# Patient Record
Sex: Female | Born: 1955
Health system: Southern US, Community
[De-identification: ages and names within clinical notes are randomized; demographics above are authoritative.]

## PROBLEM LIST (undated history)

## (undated) DIAGNOSIS — J40 Bronchitis, not specified as acute or chronic: Secondary | ICD-10-CM

## (undated) DIAGNOSIS — I10 Essential (primary) hypertension: Secondary | ICD-10-CM

## (undated) HISTORY — DX: Bronchitis, not specified as acute or chronic: J40

## (undated) HISTORY — DX: Essential (primary) hypertension: I10

## (undated) HISTORY — PX: TUBAL LIGATION: SHX77

---

## 2000-10-21 ENCOUNTER — Other Ambulatory Visit: Admission: RE | Admit: 2000-10-21 | Discharge: 2000-10-21 | Payer: Self-pay | Admitting: Obstetrics and Gynecology

## 2001-12-01 ENCOUNTER — Other Ambulatory Visit: Admission: RE | Admit: 2001-12-01 | Discharge: 2001-12-01 | Payer: Self-pay | Admitting: Obstetrics and Gynecology

## 2005-01-15 ENCOUNTER — Ambulatory Visit: Payer: Self-pay | Admitting: Internal Medicine

## 2005-01-16 ENCOUNTER — Ambulatory Visit: Payer: Self-pay | Admitting: Internal Medicine

## 2006-12-05 ENCOUNTER — Encounter: Admission: RE | Admit: 2006-12-05 | Discharge: 2006-12-05 | Payer: Self-pay | Admitting: Obstetrics & Gynecology

## 2008-04-07 HISTORY — PX: NASAL SINUS SURGERY: SHX719

## 2008-11-11 ENCOUNTER — Ambulatory Visit: Payer: Self-pay | Admitting: Internal Medicine

## 2008-11-11 DIAGNOSIS — J328 Other chronic sinusitis: Secondary | ICD-10-CM | POA: Insufficient documentation

## 2008-11-11 DIAGNOSIS — J441 Chronic obstructive pulmonary disease with (acute) exacerbation: Secondary | ICD-10-CM | POA: Insufficient documentation

## 2008-11-11 LAB — CONVERTED CEMR LAB
Basophils Absolute: 0 10*3/uL (ref 0.0–0.1)
Eosinophils Relative: 7.6 % — ABNORMAL HIGH (ref 0.0–5.0)
Hemoglobin: 12.7 g/dL (ref 12.0–15.0)
Lymphocytes Relative: 17.8 % (ref 12.0–46.0)
Monocytes Relative: 8.1 % (ref 3.0–12.0)
Neutro Abs: 3 10*3/uL (ref 1.4–7.7)
RBC: 4.64 M/uL (ref 3.87–5.11)
RDW: 15.1 % — ABNORMAL HIGH (ref 11.5–14.6)
WBC: 4.5 10*3/uL (ref 4.5–10.5)

## 2008-11-23 DIAGNOSIS — I10 Essential (primary) hypertension: Secondary | ICD-10-CM | POA: Insufficient documentation

## 2008-11-28 ENCOUNTER — Telehealth (INDEPENDENT_AMBULATORY_CARE_PROVIDER_SITE_OTHER): Payer: Self-pay | Admitting: *Deleted

## 2008-12-27 ENCOUNTER — Ambulatory Visit: Payer: Self-pay | Admitting: Internal Medicine

## 2008-12-28 ENCOUNTER — Telehealth: Payer: Self-pay | Admitting: Internal Medicine

## 2008-12-30 ENCOUNTER — Ambulatory Visit: Payer: Self-pay | Admitting: Internal Medicine

## 2009-01-16 ENCOUNTER — Encounter: Payer: Self-pay | Admitting: Internal Medicine

## 2009-02-02 ENCOUNTER — Ambulatory Visit: Payer: Self-pay | Admitting: Internal Medicine

## 2009-02-27 ENCOUNTER — Telehealth: Payer: Self-pay | Admitting: Internal Medicine

## 2009-03-03 ENCOUNTER — Telehealth: Payer: Self-pay | Admitting: Internal Medicine

## 2009-03-03 ENCOUNTER — Ambulatory Visit: Payer: Self-pay | Admitting: Internal Medicine

## 2009-03-07 ENCOUNTER — Ambulatory Visit: Payer: Self-pay | Admitting: Internal Medicine

## 2009-03-14 ENCOUNTER — Encounter: Payer: Self-pay | Admitting: Internal Medicine

## 2009-03-27 ENCOUNTER — Telehealth (INDEPENDENT_AMBULATORY_CARE_PROVIDER_SITE_OTHER): Payer: Self-pay | Admitting: *Deleted

## 2009-04-17 ENCOUNTER — Encounter: Payer: Self-pay | Admitting: Internal Medicine

## 2009-04-28 ENCOUNTER — Encounter: Payer: Self-pay | Admitting: Internal Medicine

## 2009-05-02 ENCOUNTER — Telehealth: Payer: Self-pay | Admitting: Internal Medicine

## 2009-05-19 ENCOUNTER — Encounter: Payer: Self-pay | Admitting: Internal Medicine

## 2009-06-21 ENCOUNTER — Encounter: Payer: Self-pay | Admitting: Internal Medicine

## 2009-06-28 ENCOUNTER — Encounter: Payer: Self-pay | Admitting: Internal Medicine

## 2009-08-02 ENCOUNTER — Ambulatory Visit: Payer: Self-pay | Admitting: Internal Medicine

## 2009-08-02 LAB — CONVERTED CEMR LAB
Basophils Relative: 0.9 % (ref 0.0–3.0)
Eosinophils Relative: 0.9 % (ref 0.0–5.0)
Hemoglobin: 13.4 g/dL (ref 12.0–15.0)
Lymphocytes Relative: 23.9 % (ref 12.0–46.0)
MCV: 90.2 fL (ref 78.0–100.0)
Monocytes Absolute: 0.8 10*3/uL (ref 0.1–1.0)
Neutrophils Relative %: 66.8 % (ref 43.0–77.0)
RBC: 4.62 M/uL (ref 3.87–5.11)
WBC: 10 10*3/uL (ref 4.5–10.5)

## 2009-08-09 ENCOUNTER — Encounter: Payer: Self-pay | Admitting: Internal Medicine

## 2009-08-09 ENCOUNTER — Telehealth: Payer: Self-pay | Admitting: Internal Medicine

## 2009-08-09 LAB — CONVERTED CEMR LAB: IgE (Immunoglobulin E), Serum: 142.5 intl units/mL (ref 0.0–180.0)

## 2009-08-18 ENCOUNTER — Ambulatory Visit: Payer: Self-pay | Admitting: Internal Medicine

## 2009-08-21 ENCOUNTER — Encounter: Payer: Self-pay | Admitting: Internal Medicine

## 2009-08-28 ENCOUNTER — Encounter: Payer: Self-pay | Admitting: Internal Medicine

## 2009-08-28 ENCOUNTER — Telehealth: Payer: Self-pay | Admitting: Internal Medicine

## 2009-08-28 DIAGNOSIS — J45909 Unspecified asthma, uncomplicated: Secondary | ICD-10-CM | POA: Insufficient documentation

## 2009-08-29 ENCOUNTER — Telehealth: Payer: Self-pay | Admitting: Internal Medicine

## 2009-08-29 ENCOUNTER — Encounter: Payer: Self-pay | Admitting: Internal Medicine

## 2009-09-04 ENCOUNTER — Encounter: Payer: Self-pay | Admitting: Internal Medicine

## 2009-09-12 ENCOUNTER — Telehealth (INDEPENDENT_AMBULATORY_CARE_PROVIDER_SITE_OTHER): Payer: Self-pay | Admitting: *Deleted

## 2009-10-02 ENCOUNTER — Telehealth: Payer: Self-pay | Admitting: Internal Medicine

## 2009-10-09 ENCOUNTER — Encounter: Payer: Self-pay | Admitting: Internal Medicine

## 2009-11-28 ENCOUNTER — Ambulatory Visit: Payer: Self-pay | Admitting: Internal Medicine

## 2009-11-28 ENCOUNTER — Telehealth: Payer: Self-pay | Admitting: Internal Medicine

## 2009-12-05 ENCOUNTER — Encounter: Payer: Self-pay | Admitting: Internal Medicine

## 2009-12-06 ENCOUNTER — Ambulatory Visit: Payer: Self-pay | Admitting: Internal Medicine

## 2009-12-06 DIAGNOSIS — J45909 Unspecified asthma, uncomplicated: Secondary | ICD-10-CM | POA: Insufficient documentation

## 2009-12-21 ENCOUNTER — Ambulatory Visit: Payer: Self-pay | Admitting: Internal Medicine

## 2009-12-22 ENCOUNTER — Ambulatory Visit: Payer: Self-pay | Admitting: Internal Medicine

## 2010-01-09 ENCOUNTER — Ambulatory Visit: Payer: Self-pay | Admitting: Internal Medicine

## 2010-01-22 ENCOUNTER — Ambulatory Visit: Payer: Self-pay | Admitting: Internal Medicine

## 2010-02-05 ENCOUNTER — Ambulatory Visit: Payer: Self-pay | Admitting: Internal Medicine

## 2010-02-19 ENCOUNTER — Ambulatory Visit: Payer: Self-pay | Admitting: Internal Medicine

## 2010-03-05 ENCOUNTER — Ambulatory Visit: Payer: Self-pay | Admitting: Internal Medicine

## 2010-03-19 ENCOUNTER — Telehealth (INDEPENDENT_AMBULATORY_CARE_PROVIDER_SITE_OTHER): Payer: Self-pay | Admitting: *Deleted

## 2010-03-19 ENCOUNTER — Ambulatory Visit: Payer: Self-pay | Admitting: Internal Medicine

## 2010-03-19 DIAGNOSIS — J0411 Acute tracheitis with obstruction: Secondary | ICD-10-CM | POA: Insufficient documentation

## 2010-03-23 ENCOUNTER — Ambulatory Visit: Payer: Self-pay | Admitting: Cardiology

## 2010-04-12 ENCOUNTER — Encounter: Payer: Self-pay | Admitting: Internal Medicine

## 2010-04-13 ENCOUNTER — Ambulatory Visit: Payer: Self-pay | Admitting: Internal Medicine

## 2010-04-18 LAB — CONVERTED CEMR LAB: Toxoplasma IgG Ratio: <0.5

## 2010-05-03 ENCOUNTER — Ambulatory Visit: Payer: Self-pay | Admitting: Internal Medicine

## 2010-05-04 ENCOUNTER — Ambulatory Visit: Payer: Self-pay | Admitting: Infectious Diseases

## 2010-05-04 LAB — CONVERTED CEMR LAB
AST: 25 units/L (ref 0–37)
Alkaline Phosphatase: 70 units/L (ref 39–117)
BUN: 19 mg/dL (ref 6–23)
Basophils Absolute: 0 10*3/uL (ref 0.0–0.1)
Basophils Relative: 0 % (ref 0–1)
Creatinine, Ser: 0.83 mg/dL (ref 0.40–1.20)
Eosinophils Relative: 2 % (ref 0–5)
Glucose, Bld: 181 mg/dL — ABNORMAL HIGH (ref 70–99)
HCT: 42 % (ref 36.0–46.0)
Hemoglobin: 13.4 g/dL (ref 12.0–15.0)
Lymphocytes Relative: 12 % (ref 12–46)
MCHC: 31.9 g/dL (ref 30.0–36.0)
Monocytes Absolute: 0.4 10*3/uL (ref 0.1–1.0)
Monocytes Relative: 5 % (ref 3–12)
Neutro Abs: 5.5 10*3/uL (ref 1.7–7.7)
RBC: 4.64 M/uL (ref 3.87–5.11)
RDW: 14.3 % (ref 11.5–15.5)

## 2010-05-07 ENCOUNTER — Encounter: Payer: Self-pay | Admitting: Infectious Diseases

## 2010-05-09 ENCOUNTER — Encounter: Payer: Self-pay | Admitting: Internal Medicine

## 2010-05-23 ENCOUNTER — Encounter: Payer: Self-pay | Admitting: Internal Medicine

## 2010-05-24 ENCOUNTER — Telehealth (INDEPENDENT_AMBULATORY_CARE_PROVIDER_SITE_OTHER): Payer: Self-pay | Admitting: *Deleted

## 2010-06-06 ENCOUNTER — Encounter: Payer: Self-pay | Admitting: Internal Medicine

## 2010-07-03 ENCOUNTER — Encounter: Payer: Self-pay | Admitting: Internal Medicine

## 2010-07-18 ENCOUNTER — Ambulatory Visit
Admission: RE | Admit: 2010-07-18 | Discharge: 2010-07-18 | Payer: Self-pay | Source: Home / Self Care | Attending: Internal Medicine | Admitting: Internal Medicine

## 2010-07-30 ENCOUNTER — Encounter: Payer: Self-pay | Admitting: Internal Medicine

## 2010-08-07 ENCOUNTER — Encounter: Payer: Self-pay | Admitting: Internal Medicine

## 2010-08-07 NOTE — Miscellaneous (Signed)
Summary: HIPAA Resrrictions  HIPAA Resrrictions   Imported By: Florinda Marker 05/08/2010 10:11:12  _____________________________________________________________________  External Attachment:    Type:   Image     Comment:   External Document

## 2010-08-07 NOTE — Assessment & Plan Note (Signed)
Summary: xolair/apc  Nurse Visit   Allergies: No Known Drug Allergies  Medication Administration  Injection # 1:    Medication: Xolair (omalizumab) 150mg     Diagnosis: ASTHMA, EXTRINSIC (ICD-493.00)    Route: SQ    Site: R deltoid    Exp Date: 11/05/2012    Lot #: [id number]    Mfr: GENENTECH    Comments: 0.9 ML IN RIGHT AND LEFT ARM PT WAITED 30 MINS CHARGED [phone]    Patient tolerated injection without complications    Given by: TAMMY SCOTT IN [facility]ERGY LAB   Medication Administration  Injection # 1:    Medication: Xolair (omalizumab) 150mg     Diagnosis: ASTHMA, EXTRINSIC (ICD-493.00)    Route: SQ    Site: R deltoid    Exp Date: 11/05/2012    Lot #: [id number]    Mfr: GENENTECH    Comments: 0.9 ML IN RIGHT AND LEFT ARM PT WAITED 30 MINS CHARGED [phone]    Patient tolerated injection without complications    Given by: TAMMY SCOTT IN [facility]ERGY LAB

## 2010-08-07 NOTE — Letter (Signed)
Summary: [facility]  [facility]   Imported By: Sherian Rein 09/01/2009 09:35:04  _____________________________________________________________________  External Attachment:    Type:   Image     Comment:   External Document

## 2010-08-07 NOTE — Assessment & Plan Note (Signed)
Summary: ROV 3 WKS ///KP   Primary Provider/Referring Provider:  Dr. Geoffry Paradise  CC:  3 week follow up visit-sinus infection again.Marland Kitchen  History of Present Illness: December 22, 2009- Rhinosinusitis, bronchitis/ Xolair, cough.............................daughter here Cough and nasal discharge are now white, which is better. After 2 Xolair injections she notes cough is much better. Still some head congestion. Continues nasal vancomycin nebs. Going to beach for 2 weeks. Her office building is going to be checked for mold.  March 19, 2010- Rhinosinusitis, bronchitis/ Xolair/ steroids, cough Has been down to 2.5 mg prednisone for last 2 weeks. For last week  has been coughing more. ENT put her on omeprazole. Raspy wheeze seems to come from her throat esp while lying down. Harsh cough. Nonproductive and not itching. Not sure if Xolair was doing anything in retrospect, now that prednisone is lower. She feels "cellophane" sensation over windpipe at level of larynx. Feels sore at larynx when she swallows without hangup. She feels there is a mechanical event at the level of larynx and we discussed trying to better image this.  April 13, 2010- Rhinosinusitis, bronchitis/ Xolair/ steroids, cough...........daughter here cc: 3 week follow up visit-sinus infection again. Sinusitis and polyps are back. Saw Dr [name] yesterday. He recultured and will give antibioitic based on outcome. . 5 years ago got GI parasite in [place], treated by Dr [name]. All her respiratory problems began after she cleaned a dirty apartment with a lot of cats. She now asks if I can check for toxoplasmosis. We reviewed her CT sinus, which was broadly done and significant for chronic sinusitis. She is snoring. We reviewed her [facility] immunology studies.  Off prednisone now 2.5 weeks as isntructed. She stopped Xolair as ineffective.  I spoke with Dr [name] while she was here. He has considered retry of nasal aerosol antibiotics based  on pending culture. She reminds me that she was treated with prolonged nasal antibiotics from [facility] in the past.    Asthma History    Asthma Control Assessment:    Age range: 12+ years    Symptoms: throughout the day    Nighttime Awakenings: 0-2/month    Interferes w/ normal activity: some limitations    SABA use (not for EIB): >2 days/week    Asthma Control Assessment: Very Poorly Controlled   Preventive Screening-Counseling & Management  Alcohol-Tobacco     Smoking Status: never  Current Medications (verified): 1)  Furosemide 40 Mg Tabs (Furosemide) .... Take One Tablet Daily. 2)  Klor-Con 20 Meq Pack (Potassium Chloride) .... Take One Tablet Daily. 3)  Ferus 150 Mg Caps (Polysaccharide Iron Complex) .... Take One Tablet Daily. 4)  Dulera 200-5 Mcg/act Aero (Mometasone Furo-Formoterol Fum) .... 2 Puffs and Rinse Well, Twice Daily Instead of Advair--Sample 5)  Saline Nasal Rinse .... Use Two Times A Day 6)  Zoloft 100 Mg Tabs (Sertraline Hcl) .... Take 1 and 1/2 Tablets Daily. (150 Mg) 7)  Alprazolam 0.25 Mg Tabs (Alprazolam) .... Use As Needed 8)  Calcium 1200-1000 Mg-Unit Chew (Calcium Carbonate-Vit D-Min) .... Take 1 By Mouth Once Daily 9)  Multivitamins  Tabs (Multiple Vitamin) .... Take 1 By Mouth Once Daily 10)  Diovan 160 Mg Tabs (Valsartan) .Marland Kitchen.. 1 Once Daily 11)  Aldactone 25 Mg Tabs (Spironolactone) .Marland Kitchen.. 1 Once Daily 12)  Omeprazole 40 Mg Cpdr (Omeprazole) .... Take 1 By Mouth Once Daily 13)  Sinus Neb .... Vancomycin.saline.nasonex Two Times A Day 14)  Epipen 0.3 Mg/0.59ml Devi (Epinephrine) .... For Severe Allergic Reaction  Allergies (verified): No Known Drug Allergies  Past History:  Past Medical History: Last updated: 03/19/2010 Chronic sinusitis with polyps- ok ASA- Skin Test 02/02/09 Asthma Bronchitis Hypertension  Past Surgical History: Last updated: 08/02/2009 Endoscopic sinus surgery 10/09- Dr Pollyann Kennedy, Nov 2010- Dr [name] Tubal ligation  Family  History: Last updated: 03/07/2009 Heart Disease - father, brother Clotting Disorders - mother, sister Mother had DVT/PE after ankle fx Cancer - sister; melanoma Stroke - father  Social History: Last updated: 11/11/2008 Married children, grandchild 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  The patient denies shortness of breath at rest, productive cough, coughing up blood, chest pain, irregular heartbeats, acid heartburn, indigestion, loss of appetite, weight change, abdominal pain, difficulty swallowing, sore throat, tooth/dental problems, headaches, ear ache, hand/feet swelling, joint stiffness or pain, and rash.    Vital Signs:  Patient profile:   55 year old female Height:      72 inches Weight:      221.38 pounds BMI:     30.13 O2 Sat:      98 % on Room air Pulse rate:   93 / minute BP sitting:   140 / 92  (right arm) Cuff size:   regular  Vitals Entered By: [name] CMA (April 13, 2010 3:14 PM)  O2 Flow:  Room air CC: 3 week follow up visit-sinus infection again.   Physical Exam  Additional Exam:  General: A/Ox3; pleasant and cooperative, NAD, tall.  SKIN: no rash, lesions,  NODES: no lymphadenopathy HEENT: [place]/AT, EOM- WNL, Conjuctivae- clear, PERRLA, Periorbital edema-  TM-WNL, Nose- mucosa pale, unobstructed,  . Throat- clear and wnl, Mallampati  II, voice normal, no stridor NECK: Supple w/ fair ROM, JVD- none, normal carotid impulses w/o bruits Thyroid- ,  CHEST: clear to P&A- no wheeze, but loose cough with deep breath. HEART: RRR, no m/g/r heard ABDOMEN:soft, not distended UEA:VWUJ, nl pulses, no edema  NEURO: Grossly intact to observation      CT of Sinus  Procedure date:   03/23/2010  Findings:      CT NECK W/CM - [id number]   Clinical Data: 55 year old female with questionable laryngeal obstruction.  Cough and  change in vocal quality.  No relief with steroid therapy.   CT NECK WITH CONTRAST   Technique:  Multidetector CT imaging of the neck was performed with intravenous contrast.   Contrast: 75 ml Omnipaque-300.   Comparison: None.   Findings: No lymphadenopathy.  Pharyngeal mucosal spaces and the thyroid are within normal limits.  Parapharyngeal, retropharyngeal and sublingual spaces are within normal limits.  Submandibular and parotid glands are within normal limits. Visualized superior mediastinum is within normal limits; multiple small nodes are identified including in the aorticopulmonary window.   Visualized major vascular structures are patent.  There is mild atherosclerosis at the left carotid bifurcation. Degenerative changes in the cervical spine maximal at C4-C5. No acute osseous abnormality identified.  Visualized brain parenchyma is within normal limits (suspect artifactual streak in the right paracentral pons on series 2 image 1).   Diffuse paranasal sinus mucosal thickening with mixed low and high density material in the maxillary sinuses.  Sequelae of maxillary antrostomies.  Tympanic cavities and mastoids are within normal limits.   Mild  dependent atelectasis in the visualized lungs.  No confluent airspace disease.   IMPRESSION: 1.  No abnormality identified in the pharynx.  Still, if voice changes persist recommend ENT consult for direct visualization of the vocal cords. 2. No acute findings identified in the neck. 3.  Diffuse paranasal sinus inflammatory changes with evidence of prior maxillary antrostomy, favor chronic sinusitis.   Read By:  Augusto Gamble,  M.D.     Released By:  Augusto Gamble,  M.D.  __________  Impression & Recommendations:  Problem # 1:  RHINOSINUSITIS, CHRONIC (ICD-473.8) Assessment  Unchanged Discussed today by phone with Dr [name]. We will wait on his cultures. She had done vancomycin nasal aerosol before but may need to try that again. This process began after cleaning apartment. Question whether original etiology is still present, or started a self-sustaining chronic inflammatory process.  I doubt Toxoplasmosis, but agreed to the assay.  Problem # 2:  EXTRINSIC ASTHMA, UNSPECIFIED (ICD-493.00) She got worse as she got down to 2.5 mg of prednisone again. I don't think she is adrenal insufficient but will be able to check that later. She really wants to be off steroids. Despite elevated IgE, her lack of strong response to Xolair suggests her symptoms aren't primarily IgE mediated at this point.  Other Orders: Est. Patient Level IV (16109) T- * Misc. Laboratory test [phone])  Patient Instructions: 1)  Please schedule a follow-up appointment in 3 weeks. 2)  Lab 3)  cc Dr [name], Dr [name]     CT of Sinus  Procedure date:  03/23/2010  Findings:      CT NECK W/CM - [id number]   Clinical Data: 55 year old female with questionable laryngeal obstruction.  Cough and  change in vocal quality.  No relief with steroid therapy.   CT NECK WITH CONTRAST   Technique:  Multidetector CT imaging of the neck was performed with intravenous contrast.   Contrast: 75 ml Omnipaque-300.   Comparison: None.   Findings: No lymphadenopathy.  Pharyngeal mucosal spaces and the thyroid are within normal limits.  Parapharyngeal, retropharyngeal and sublingual spaces are within normal limits.  Submandibular and parotid glands are within normal limits. Visualized superior mediastinum is within normal limits; multiple small nodes are identified including in the aorticopulmonary window.   Visualized major vascular structures are patent.  There is mild atherosclerosis at the left carotid bifurcation. Degenerative changes in the cervical spine maximal at C4-C5. No acute  osseous abnormality identified.  Visualized brain parenchyma is within normal limits (suspect artifactual streak in the right paracentral pons on series 2 image 1).   Diffuse paranasal sinus mucosal thickening with mixed low and high density material in the maxillary sinuses.  Sequelae of maxillary antrostomies.  Tympanic cavities and mastoids are within normal limits.   Mild dependent atelectasis in the visualized lungs.  No confluent airspace disease.   IMPRESSION: 1.  No abnormality identified in the pharynx.  Still, if voice changes persist recommend ENT consult for direct visualization of the vocal cords. 2. No acute findings identified in the neck. 3.  Diffuse paranasal sinus inflammatory changes with evidence of prior maxillary antrostomy, favor chronic sinusitis.   Read By:  Augusto Gamble,  M.D.     Released By:  Augusto Gamble,  M.D.  __________

## 2010-08-07 NOTE — Progress Notes (Signed)
Summary: Increase in Prednisone over the weekend  Phone Note Call from Patient Call back at Work Phone [phone] Call back at tell them who you are   Caller: Patient Call For: young Reason for Call: Talk to Nurse Summary of Call: need to speak to Katie re: prednisone use.  Cell [phone] Initial call taken by: Eugene Gavia,  August 28, 2009 8:50 AM  Follow-up for Phone Call        The patient c/o wheezing, cough and chest tightness since Thurs, 08/24/09. The chest tightness got worse on Sar, 08/26/09 and she had to take 20mg  of Prednisone and has remained on that amount. She wants to know if CDY thinks she should go ahead and begin the Xolair injections before the allergy season really starts. She want s to get off of the Prednisone as quickly as possible. Please advise. Follow-up by: Michel Bickers CMA,  August 28, 2009 9:17 AM  Additional Follow-up for Phone Call Additional follow up Details #1::        We have discussed xolair. Since she remains symptomatic and prednisone dependent we will proceed.  Pt came in today and signed paperwork. Faxed Rx to [employer]. Process is pending. Will notifiy pt to schedule first xolair injection once paperwork has been completed. Alfonso Ramus  August 29, 2009 11:56 AM   New Problems: ASTHMA (ICD-493.90)   Additional Follow-up for Phone Call Additional follow up Details #2::    The patient is aware CDY thinks she should begin Xolair inj and has sent an order to [facility] to get this process started.  She will come by to sign papers on Tues., 08/29/09 and ask for [facility].Michel Bickers [facility]  August 28, 2009 11:09 AM  New Problems: ASTHMA (ICD-493.90) New/Updated Medications: XOLAIR 150 MG SOLR (OMALIZUMAB)

## 2010-08-07 NOTE — Letter (Signed)
Summary: [facility]  [facility]   Imported By: Lester [place]sboro 12/13/2009 10:29:45  _____________________________________________________________________  External Attachment:    Type:   Image     Comment:   External Document

## 2010-08-07 NOTE — Consult Note (Signed)
Summary: [facility]  [facility]   Imported By: Sherian Rein 05/30/2010 08:52:22  _____________________________________________________________________  External Attachment:    Type:   Image     Comment:   External Document

## 2010-08-07 NOTE — Assessment & Plan Note (Signed)
Summary: xolair/jd  Nurse Visit   Allergies: No Known Drug Allergies  Medication Administration  Injection # 1:    Medication: Xolair (omalizumab) 150mg     Diagnosis: ASTHMA, EXTRINSIC (ICD-493.00)    Route: SQ    Site: R deltoid    Exp Date: 01/05/2013    Lot #: [id number]    Mfr: Salome Spotted    Comments: 0.9 ML IN LEFT AND RIGHT ARM PT WAITED 30 MINS CHARGED 96401 X 2    Patient tolerated injection without complications    Given by: TAMMY SCOTT IN [facility]ERGY LAB    Medication Administration  Injection # 1:    Medication: Xolair (omalizumab) 150mg     Diagnosis: ASTHMA, EXTRINSIC (ICD-493.00)    Route: SQ    Site: R deltoid    Exp Date: 01/05/2013    Lot #: [id number]    Mfr: Salome Spotted    Comments: 0.9 ML IN LEFT AND RIGHT ARM PT WAITED 30 MINS CHARGED 96401 X 2    Patient tolerated injection without complications    Given by: TAMMY SCOTT IN [facility]ERGY LAB

## 2010-08-07 NOTE — Progress Notes (Signed)
Summary: needs f/u appt  Phone Note Call from Patient   Caller: Patient Call For: young Summary of Call: Kelli Fleming, this pt was seen today and is requesting to f/u w/ dr young the week of 6/13 - 6/17 - prefers the 17th if you can open the late RN time slot. [phone] Initial call taken by: Tivis Ringer, CNA,  Nov 28, 2009 5:26 PM  Follow-up for Phone Call        [facility] to put pt in 415 slot on 12-22-09. Pt aware of appt time and date.[name] CMA  Nov 29, 2009 8:45 AM

## 2010-08-07 NOTE — Assessment & Plan Note (Signed)
Summary: xolair/ mbw  Nurse Visit   Allergies: No Known Drug Allergies  Medication Administration  Injection # 1:    Medication: Xolair (omalizumab) 150mg     Diagnosis: ASTHMA, EXTRINSIC (ICD-493.00)    Route: IM    Site: R deltoid    Exp Date: 02/05/2013    Lot #: [id number]    Mfr: [employer]    Comments: xolair 225 mg x 60 units  0.9 ml x 1 in right deltoid and 0.9 ml x 1 in left deltoid pt waited 20 mins    Given by: Tammy Acott, Allergy tech  Orders Added: 1)  Administration xolair injection [phone]

## 2010-08-07 NOTE — Progress Notes (Signed)
Summary: cough/requesting appointment today  Phone Note Call from Patient Call back at [phone]/ press 0   Caller: Patient Call For: young Summary of Call: pt coughing and would like to be worked in today to see dr young she was offered to come in and see tammy she refused,pt speicfied that she was told by dr young to talk with Florentina Addison. Initial call taken by: Rickard Patience,  March 19, 2010 8:40 AM  Follow-up for Phone Call        Pt c/o increased SOB "all the time", cough "bad again" and mucus getting "stuck in throat" worse starting Friday AM. Prednisone taper is down to 2.5mg  daily. Pt states has apppointment to come in today @ 5pm for Xolair and would like to be seen by CY before then. Please advise. Thanks. Please call pt back on cell if between 12-1pm at [phone]. Zackery Barefoot CMA  March 19, 2010 9:51 AM   NKDA  Additional Follow-up for Phone Call Additional follow up Details #1::        Please have pt come in today at 4 for 415 appoitment.[name] CMA  March 19, 2010 10:21 AM     Additional Follow-up for Phone Call Additional follow up Details #2::    Spoke with pt and sched appt for 4:15 and pt aware to be here at 4 pm. Follow-up by: Vernie Murders,  March 19, 2010 10:32 AM

## 2010-08-07 NOTE — Assessment & Plan Note (Signed)
Summary: 3 week follow up/review Xolair start ?/ct results/kcw   Primary Provider/Referring Provider:  Dr. Geoffry Fleming  CC:  follow up visit-CT results and ? start Xolair.Marland Kitchen  History of Present Illness:  03/07/09- Rhinosinusitis, cough, bornchitis She had  called back after last visit, tired of coughing and asking for a CT scan, which insurance wouldn't aurthorize without prior CXR and more data.  CXR was normal.She feels she is getting sick again- seeing more "fungus" black specks in Neti pot rinse from her nose. When not on prednisone, she feels the source of cough at larynx, without wheeze, chest pain or productive cough.  Frustrated that she still sees "fungus" now after 6 months with itraconazole rinse.  Needing to stay on Advair 250 plus prednisone 10 mg to barely control cough and tightness.   August 02, 2009- Sinus disease much bette since second sinus surgery by Dr Kelli Fleming. This has not stopped her cough. She also notes "thick and spongey feeling" in her throat when she pushes on suprasternal notch. She took 2 courses of omnicef and cough is no longer productive of green and yellow as it had been. Did have some fever. She is down to prednisone 20 mg.  We had ordered a CT chest last Spring, denied by [facility] so we did CXR instead.  August 18, 2009- Rhinosinusitis, bronchitis, cough She reduced prednisone from 20 mg, now down to 10 mg daily. With reduction she notices minimal occasional chest tightness, and cough is virtually gone. She had f/u with Dr Kelli Fleming who questioned GERD and put her on omeprazole. We discussed reflux measures.She denies heartburn. CT - minor biapical densities, likely scar, but without obvious chronic bronchitis/ broncheictasis. CBC-WNL, Eos 0.9% IgE-IgE elevated 142.5    Current Medications (verified): 1)  Furosemide 40 Mg Tabs (Furosemide) .... Take One Tablet Daily. 2)  Klor-Con 20 Meq Pack (Potassium Chloride) .... Take One Tablet Daily. 3)  Ferus  150 Mg Caps (Polysaccharide Iron Complex) .... Take One Tablet Daily. 4)  Advair Diskus 250-50 Mcg/dose  Misc (Fluticasone-Salmeterol) .... One Puff Twice Daily 5)  Saline Nasal Rinse .... Use Two Times A Day 6)  Mucinex Maximum Strength 1200 Mg Xr12h-Tab (Guaifenesin) .... Take One Tablet Two Times A Day 7)  Zoloft 100 Mg Tabs (Sertraline Hcl) .... Take 1 and 1/2 Tablets Daily. (150 Mg) 8)  Alprazolam 0.25 Mg Tabs (Alprazolam) .... Use As Needed 9)  Prednisone 10 Mg Tabs (Prednisone) .... Take 15mg -20mg  Daily 10)  Calcium 1200-1000 Mg-Unit Chew (Calcium Carbonate-Vit D-Min) .... Take 1 By Mouth Once Daily 11)  Multivitamins  Tabs (Multiple Vitamin) .... Take 1 By Mouth Once Daily 12)  Nasonex 50 Mcg/act  Susp (Mometasone Furoate) .Marland Kitchen.. 1 To 2 Sprays in Each Nostril Once Daily 13)  Diovan 160 Mg Tabs (Valsartan) .Marland Kitchen.. 1 Once Daily 14)  Aldactone 25 Mg Tabs (Spironolactone) .Marland Kitchen.. 1 Once Daily 15)  Omeprazole 40 Mg Cpdr (Omeprazole) .... Take 1 By Mouth Once Daily  Allergies (verified): No Known Drug Allergies  Past History:  Past Medical History: Last updated: 02/02/2009 Chronic sinusitis with polyps- ok ASA- Skin Test 02/02/09 Hypertension  Past Surgical History: Last updated: 08/02/2009 Endoscopic sinus surgery 10/09- Dr Kelli Fleming, Nov 2010- Dr Kelli Fleming Tubal ligation  Family History: Last updated: 03/07/2009 Heart Disease - father, brother Clotting Disorders - mother, sister Mother had DVT/PE after ankle fx Cancer - sister; melanoma Stroke - father  Social History: Last updated: 11/11/2008 Married children, grandchild 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  The patient denies anorexia, fever, weight loss, vision loss, decreased hearing, hoarseness, chest pain, syncope, peripheral edema, prolonged cough, headaches, hemoptysis, and severe indigestion/heartburn.    Vital Signs:  Patient profile:   55  year old female Height:      72 inches Weight:      218.38 pounds BMI:     29.72 O2 Sat:      98 % on Room air Pulse rate:   103 / minute BP sitting:   114 / 80  (left arm) Cuff size:   regular  Vitals Entered By: [name] CMA (August 18, 2009 11:39 AM)  O2 Flow:  Room air  Physical Exam  Additional Exam:  General: A/Ox3; pleasant and cooperative, NAD, tall. pleasant, weight gain SKIN: no rash, lesions, mild flusing NODES: no lymphadenopathy HEENT: Felt/AT, EOM- WNL, Conjuctivae- clear, PERRLA, TM-WNL, Nose- mucosa pale, unobstructed,  . Throat- clear and wnl, Melampatti II, can't exclude minimal thrush NECK: Supple w/ fair ROM, JVD- none, normal carotid impulses w/o bruits Thyroid- , no stridor CHEST: clear  to P&A, no cough rales or rhonchi now HEART: RRR, no m/g/r heard ABDOMEN:soft, not distended ZOX:WRUE, nl pulses, no edema  NEURO: Grossly intact to observation      Impression & Recommendations:  Problem # 1:  BRONCHITIS (ICD-490)  She is tolerating gradual prednisone taper.She is unsure about slight chest tightness. She would be a reasonable Xolair candidate if we can't get her off systemic steroids while maintaining Advair. Her updated medication list for this problem includes:    Advair Diskus 250-50 Mcg/dose Misc (Fluticasone-salmeterol) ..... One puff twice daily    Mucinex Maximum Strength 1200 Mg Xr12h-tab (Guaifenesin) .Marland Kitchen... Take one tablet two times a day  Problem # 2:  RHINOSINUSITIS, CHRONIC (ICD-473.8) After surgery she is having less discomfort. She continues tof/u with Dr Kelli Fleming.  Medications Added to Medication List This Visit: 1)  Omeprazole 40 Mg Cpdr (Omeprazole) .... Take 1 by mouth once daily  Other Orders: Est. Patient Level III (45409)  Patient Instructions: 1)  Please schedule a follow-up appointment in 6 weeks. 2)  Take prednisone 10 mg daily for total 2 weeks 3)  Then, if stable, reduce to 1/2 tab = 5 mg daily for 2 weeks, 4)   Then, if stable, stop prednisone 5)  Continue Advair for now.

## 2010-08-07 NOTE — Assessment & Plan Note (Signed)
Summary: lungs crakling/apc   Primary Provider/Referring Provider:  Dr. Geoffry Paradise  CC:  Wheezing and crackles in lungs.  History of Present Illness: 03/07/09- Rhinosinusitis, cough, bronchitis She had  called back after last visit, tired of coughing and asking for a CT scan, which insurance wouldn't aurthorize without prior CXR and more data.  CXR was normal.She feels she is getting sick again- seeing more "fungus" black specks in Neti pot rinse from her nose. When not on prednisone, she feels the source of cough at larynx, without wheeze, chest pain or productive cough.  Frustrated that she still sees "fungus" now after 6 months with itraconazole rinse.  Needing to stay on Advair 250 plus prednisone 10 mg to barely control cough and tightness.   August 18, 2009- Rhinosinusitis, bronchitis, cough She reduced prednisone from 20 mg, now down to 10 mg daily. With reduction she notices minimal occasional chest tightness, and cough is virtually gone. She had f/u with Dr [name] who questioned GERD and put her on omeprazole. We discussed reflux measures.She denies heartburn. CT - minor biapical densities, likely scar, but without obvious chronic bronchitis/ broncheictasis. CBC-WNL, Eos 0.9% IgE-IgE elevated 142.5  Nov 28, 2009 Rhinosinusitis, bronchitis, cough..............................Marland Kitchenhere with friend Dr [name] has her doing nasal nebs with vancomycin, saline and nasonex two times a day.for recurrent sinusitis. Saw Dr Sandy Salaam at [facility]  Pulmonary- labs scanned. IgE high/ 1200 range. PFT with Dr Ramond Dial was substantially lower than ours last year. ? Is she geting worse? 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 Oral antibiotics don't do much and she needs about 40 mg prednisone to keep symptoms suppressed. Had bone density  check. We discussed  observation for improvemnt with 2 week beach vacation in June - would this get her away from an environmental trigger? She has been cleared to start Xolair.      Current Medications (verified): 1)  Furosemide 40 Mg Tabs (Furosemide) .... Take One Tablet Daily. 2)  Klor-Con 20 Meq Pack (Potassium Chloride) .... Take One Tablet Daily. 3)  Ferus 150 Mg Caps (Polysaccharide Iron Complex) .... Take One Tablet Daily. 4)  Advair Diskus 250-50 Mcg/dose  Misc (Fluticasone-Salmeterol) .... One Puff Twice Daily 5)  Saline Nasal Rinse .... Use Two Times A Day 6)  Zoloft 100 Mg Tabs (Sertraline Hcl) .... Take 1 and 1/2 Tablets Daily. (150 Mg) 7)  Alprazolam 0.25 Mg Tabs (Alprazolam) .... Use As Needed 8)  Prednisone 10 Mg Tabs (Prednisone) .... Take 15mg -20mg  Daily 9)  Calcium 1200-1000 Mg-Unit Chew (Calcium Carbonate-Vit D-Min) .... Take 1 By Mouth Once Daily 10)  Multivitamins  Tabs (Multiple Vitamin) .... Take 1 By Mouth Once Daily 11)  Diovan 160 Mg Tabs (Valsartan) .Marland Kitchen.. 1 Once Daily 12)  Aldactone 25 Mg Tabs (Spironolactone) .Marland Kitchen.. 1 Once Daily 13)  Omeprazole 40 Mg Cpdr (Omeprazole) .... Take 1 By Mouth Once Daily 14)  Xolair 150 Mg Solr (Omalizumab) .... 225 Mg [place] Q 2 Weeks 15)  Sinus Neb .... Vancomycin.saline.nasonex Two Times A Day  Allergies (verified): No Known Drug Allergies  Past History:  Past Medical History: Last updated: 02/02/2009 Chronic sinusitis with polyps- ok ASA- Skin Test 02/02/09 Hypertension  Past Surgical History: Last updated: 08/02/2009 Endoscopic sinus surgery 10/09- Dr Pollyann Kennedy, Nov 2010- Dr [name] Tubal ligation  Family History: Last updated: 03/07/2009 Heart Disease - father, brother Clotting  Disorders - mother, sister Mother had DVT/PE after ankle fx Cancer - sister; melanoma Stroke - father  Social History: Last updated: 11/11/2008 Married children, grandchild 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  The patient denies coughing up blood, chest pain, irregular heartbeats, acid heartburn, indigestion, loss of appetite, weight change, abdominal pain, difficulty swallowing, sore throat, tooth/dental problems, and headaches.    Vital Signs:  Patient profile:   55 year old female Height:      72 inches Weight:      221 pounds BMI:     30.08 O2 Sat:      94 % on Room air Pulse rate:   128 / minute BP sitting:   138 / 90  (left arm) Cuff size:   regular  Vitals Entered By: [name] CMA (Nov 28, 2009 3:57 PM)  O2 Flow:  Room air  Physical Exam  Additional Exam:  General: A/Ox3; pleasant and cooperative, NAD, tall. pleasant,  SKIN: no rash, lesions,  NODES: no lymphadenopathy HEENT: [place]/AT, EOM- WNL, Conjuctivae- clear, PERRLA, TM-WNL, Nose- mucosa pale, unobstructed,  . Throat- clear and wnl, Mallampati  II,  NECK: Supple w/ fair ROM, JVD- none, normal carotid impulses w/o bruits Thyroid- , no stridor CHEST: Bilateral wheezey cough and wheeze with deep breath HEART: RRR, no m/g/r heard ABDOMEN:soft, not distended WUJ:WJXB, nl pulses, no edema  NEURO: Grossly intact to observation      Impression & Recommendations:  Problem # 1:  RHINOSINUSITIS, CHRONIC (ICD-473.8) Now on two times a day neb antibiotic through Dr [name]. We are watching her response.  Problem # 2:  BRONCHITIS (ICD-490)  Chronic bronchitis with elevated IgE. Legitimate question if this is related to mold/ enmvironmental exposure in her work place or not. This is the sme process involving her sinuses. I have given the name of a company that does mold evaluations but explained that presence of mold doesn't prove etiology. We agreed to give depo and pred burst to get her acute exqacerbation under  control today, then start Xolair as planned. Her friend asked about ABG- not indicated, and brochoscopy- no target. The following medications were removed from the medication list:    Mucinex Maximum Strength 1200 Mg Xr12h-tab (Guaifenesin) .Marland Kitchen... Take one tablet two times a day Her updated medication list for this problem includes:    Advair Diskus 250-50 Mcg/dose Misc (Fluticasone-salmeterol) ..... One puff twice daily    Xolair 150 Mg Solr (Omalizumab) .Marland Kitchen... 225 mg [place] q 2 weeks  Medications Added to Medication List This Visit: 1)  Sinus Neb  .... Vancomycin.saline.nasonex two times a day 2)  Prednisone 10 Mg Tabs (Prednisone) .Marland Kitchen.. 1 tab four times daily x 2 days, 3 times daily x 2 days, 2 times daily x 2 days, 1 time daily x 2 days  Other Orders: Est. Patient Level IV (14782) Admin of Therapeutic Inj  intramuscular or subcutaneous (95621) Depo- Medrol 80mg  (J1040) Nebulizer Tx (30865)  Patient Instructions: 1)  Please schedule a follow-up appointment in 1 month. 2)  depo 80 3)  Neb xop 1.25 4)  Sample Proair rescue inhaler 2 puffs four times a day as needed  5)  Script for prednisone taper- follow it but level off and hold at 20 mg daily 6)  Ok to start Xolair Prescriptions: PREDNISONE 10 MG TABS (PREDNISONE) 1 tab four times daily x 2 days, 3 times daily x 2 days, 2 times daily x 2 days, 1 time daily x 2 days  #20 x 0   Entered and Authorized by:   Waymon Budge MD   Signed by:   Waymon Budge MD on 11/28/2009   Method used:   Electronically to        [facility] (retail)       [address]       [place], [place]  [address]       Ph: [phone] or [phone]       Fax: [phone]   RxID:   [id number]    Medication Administration  Injection # 1:    Medication: Depo- Medrol 80mg     Diagnosis: BRONCHITIS (ICD-490)    Route: SQ    Site: RUOQ gluteus    Exp Date: 05/2012    Lot #: [id number]    Mfr: Pharmacia    Patient tolerated injection without  complications    Given by: [name] CMA (Nov 28, 2009 5:16 PM)  Medication # 1:    Medication: Xopenex 1.25mg     Diagnosis: BRONCHITIS (ICD-490)    Dose: 1 vial    Route: inhaled    Exp Date: 03/2010    Lot #: [id number]    Mfr: Sepracor    Patient tolerated medication without complications    Given by: [name] CMA (Nov 28, 2009 5:16 PM)  Orders Added: 1)  Est. Patient Level IV [95638] 2)  Admin of Therapeutic Inj  intramuscular or subcutaneous [96372] 3)  Depo- Medrol 80mg  [J1040] 4)  Nebulizer Tx [75643]

## 2010-08-07 NOTE — Assessment & Plan Note (Signed)
Summary: start Xolair/pt aware of 2 hour wait/kcw  Dr. Maple Hudson spoke with patient and gave orders to start Xolair today.[name] CMA  December 06, 2009 9:54 AM  CC:  Xolair Start.  Current Medications (verified): 1)  Furosemide 40 Mg Tabs (Furosemide) .... Take One Tablet Daily. 2)  Klor-Con 20 Meq Pack (Potassium Chloride) .... Take One Tablet Daily. 3)  Ferus 150 Mg Caps (Polysaccharide Iron Complex) .... Take One Tablet Daily. 4)  Advair Diskus 250-50 Mcg/dose  Misc (Fluticasone-Salmeterol) .... One Puff Twice Daily 5)  Saline Nasal Rinse .... Use Two Times A Day 6)  Zoloft 100 Mg Tabs (Sertraline Hcl) .... Take 1 and 1/2 Tablets Daily. (150 Mg) 7)  Alprazolam 0.25 Mg Tabs (Alprazolam) .... Use As Needed 8)  Prednisone 10 Mg Tabs (Prednisone) .... Take 15mg -20mg  Daily 9)  Calcium 1200-1000 Mg-Unit Chew (Calcium Carbonate-Vit D-Min) .... Take 1 By Mouth Once Daily 10)  Multivitamins  Tabs (Multiple Vitamin) .... Take 1 By Mouth Once Daily 11)  Diovan 160 Mg Tabs (Valsartan) .Marland Kitchen.. 1 Once Daily 12)  Aldactone 25 Mg Tabs (Spironolactone) .Marland Kitchen.. 1 Once Daily 13)  Omeprazole 40 Mg Cpdr (Omeprazole) .... Take 1 By Mouth Once Daily 14)  Xolair 150 Mg Solr (Omalizumab) .... 225 Mg [place] Q 2 Weeks 15)  Sinus Neb .... Vancomycin.saline.nasonex Two Times A Day 16)  Prednisone 10 Mg Tabs (Prednisone) .Marland Kitchen.. 1 Tab Four Times Daily X 2 Days, 3 Times Daily X 2 Days, 2 Times Daily X 2 Days, 1 Time Daily X 2 Days 17)  Epipen 0.3 Mg/0.51ml Devi (Epinephrine) .... For Severe Allergic Reaction  Allergies (verified): No Known Drug Allergies  Vital Signs:  Patient profile:   55 year old female Height:      72 inches Weight:      219 pounds BMI:     29.81 O2 Sat:      98 % on Room air Pulse rate:   104 / minute BP sitting:   134 / 84  (left arm) Cuff size:   regular  Vitals Entered By: [name] CMA (December 06, 2009 8:44 AM)  O2 Flow:  Room air   Other Orders: Administration xolair injection  [phone]) No Charge Patient Arrived (NCPA0) (NCPA0)   Medication Administration  Injection # 1:    Medication: Xolair (omalizumab) 150mg     Diagnosis: ASTHMA, EXTRINSIC (ICD-493.00)    Route: SQ    Site: R deltoid    Exp Date: 11/2012    Lot #: [id number]    Mfr: Mendel Ryder    Comments: Injection given by Drucie Opitz, CMA in allergy lab. Xolair 225mg . 0.9ml x 1 in Right and Left Deltoid. Pt waited the 2 hours (inital wait time) and no reactions noted.     Patient tolerated injection without complications  Orders Added: 1)  Administration xolair injection [57846] 2)  No Charge Patient Arrived (NCPA0) [NCPA0]

## 2010-08-07 NOTE — Letter (Signed)
Summary: [facility]  [facility]   Imported By: Sherian Rein 04/23/2010 14:59:29  _____________________________________________________________________  External Attachment:    Type:   Image     Comment:   External Document

## 2010-08-07 NOTE — Letter (Signed)
Summary: [facility]  [facility]   Imported By: Lester Rienzi 06/15/2010 10:47:20  _____________________________________________________________________  External Attachment:    Type:   Image     Comment:   External Document

## 2010-08-07 NOTE — Progress Notes (Signed)
Summary: Epipen for Xolair therapy  Phone Note Other Incoming   Summary of Call: Will need Epipen script while on Xolair Initial call taken by: Waymon Budge MD,  Nov 28, 2009 9:42 PM  Follow-up for Phone Call        sent to pts drug store.[name] CMA  Nov 29, 2009 8:41 AM    Pt understands to bring epi pen with her to 12-06-09 visit to start Xolair.[name] CMA  Nov 29, 2009 8:41 AM     New/Updated Medications: EPIPEN 0.3 MG/0.3ML DEVI (EPINEPHRINE) For severe allergic reaction EPIPEN 0.3 MG/0.3ML DEVI (EPINEPHRINE) Use as directed Prescriptions: EPIPEN 0.3 MG/0.3ML DEVI (EPINEPHRINE) Use as directed  #1 x 11   Entered by:   [name] CMA   Authorized by:   Waymon Budge MD   Signed by:   [name] CMA on 11/29/2009   Method used:   Electronically to        [facility] (retail)       [address]       [place], [place]  [id number]       Ph: [phone] or [phone]       Fax: [phone]   RxID:   [id number] EPIPEN 0.3 MG/0.3ML DEVI (EPINEPHRINE) For severe allergic reaction  #1 x prn   Entered and Authorized by:   Waymon Budge MD   Signed by:   Waymon Budge MD on 11/28/2009   Method used:   Historical   RxID:   [id number]

## 2010-08-07 NOTE — Progress Notes (Signed)
Summary: Xolair dosing  Phone Note Other Incoming   Summary of Call: Calculating Xolair- 99KG, IgE 142.5= Xolair 225 mg [place] q2weeks    New/Updated Medications: XOLAIR 150 MG SOLR (OMALIZUMAB) 225 mg [place] q 2 weeks

## 2010-08-07 NOTE — Miscellaneous (Signed)
Summary: [facility]  [facility]   Imported By: Sherian Rein 12/01/2009 13:23:45  _____________________________________________________________________  External Attachment:    Type:   Image     Comment:   External Document

## 2010-08-07 NOTE — Assessment & Plan Note (Signed)
Summary: 3 week follow up per CDY/kcw   Allergies: No Known Drug Allergies   Other Orders: No Charge Patient Arrived (NCPA0) (NCPA0)

## 2010-08-07 NOTE — Progress Notes (Signed)
Summary: RESULTS  Phone Note Call from Patient   Caller: Patient Call For: Seletha Zimmermann Summary of Call: PT WOULD LIKE COPY OF PFT RESULTS MAIL TO HOME Initial call taken by: Rickard Patience,  October 02, 2009 4:44 PM  Follow-up for Phone Call        pt requesting copy of PFT from june 2010. Please advise if ok to mail copy to pt. Carron Curie CMA  October 02, 2009 5:07 PM   Additional Follow-up for Phone Call Additional follow up Details #1::        OK Additional Follow-up by: Waymon Budge MD,  October 03, 2009 8:53 AM    Additional Follow-up for Phone Call Additional follow up Details #2::    PFT mailed. Pt aware. Carron Curie CMA  October 03, 2009 9:01 AM

## 2010-08-07 NOTE — Letter (Signed)
Summary: [facility]  [facility]   Imported By: Sherian Rein 12/01/2009 13:24:47  _____________________________________________________________________  External Attachment:    Type:   Image     Comment:   External Document

## 2010-08-07 NOTE — Progress Notes (Signed)
Summary: results  Phone Note Call from Patient Call back at Work Phone [phone]   Caller: Patient Call For: young Reason for Call: Talk to Nurse Summary of Call: pt would like results of her labs.  Wants to know if CDY is going recommend xolair injections/apc Initial call taken by: Eugene Gavia,  August 09, 2009 10:40 AM  Follow-up for Phone Call        I see the lab results append, but pt is wanting to know if Dr. Maple Hudson is going to rec xolair? Pelase advise. Carron Curie CMA  August 09, 2009 10:53 AM   spoke with pt; aware of her results and understands to come in on 08-18-09 at 1115am appt to discuss options of Xolair and CT results. We  cancelled orginal appt for iun March. [name] CMA  August 09, 2009 12:12 PM

## 2010-08-07 NOTE — Progress Notes (Signed)
Summary: direction  Phone Note Call from Patient Call back at Work Phone [phone]   Caller: Patient Call For: young Reason for Call: Talk to Nurse, Talk to Doctor Summary of Call: last 4 months, having cramping in hands and feet, on prednisone 1 1/2 years.  Could this be a problem, if she wears her support stockings, not as bad in her feet. Can happen up to 2-3 times at night.  Should she contact primary?  wanted to get CDY's opinion on this and ask his direction.  Started some Vit. D.  Still on hold w/[employer] re: xolair injection. Initial call taken by: Eugene Gavia,  September 12, 2009 8:45 AM  Follow-up for Phone Call        Pt c/o cramping in her feet and hands x 4 months. She states the cramping in her feet happens mostly at night. But cramping in her hands happens during the day with daily activities such as opening a jar or package. She states cramping does not happen everyday. She does notice improvement in cramping in her feet when she wears support stockings. Pt wanted to know if this could be coming from prednisone or what you may think could be causing this. Please advise. Carron Curie CMA  September 12, 2009 9:46 AM  Called [facility] and they stated that pt has order on hold. Spoke with patient and she stated that she is waiting for [employer] to process pending claims to allow her deductible to be met before she orders xolair. She stated that she would call [employer] at her lunch to see where they stand. Pt has a high deductible and she doesn't want to have to pay out of pocket for her xolair, when she knows that she has met her ded. with such procedures as MRI and interpretation of MRI. She will call me back and advise of status. Rhonda Cobb  September 12, 2009 10:14 AM   Additional Follow-up for Phone Call Additional follow up Details #1::        Cramping like this can be from low potassium. Eating K rich foods- bananas, tomatoes, orange juice might help.  Some people can drink a daily juice  glass of tonic water, flavored with OJ or tomato juice. Often people will be more achey as prednisone is lowered. This should resolve.  Additional Follow-up by: Waymon Budge MD,  September 14, 2009 1:05 PM    Additional Follow-up for Phone Call Additional follow up Details #2::    Spoke with pt and made aware of the above recs per CDY.  Pt verbalized understanding. Follow-up by: Vernie Murders,  September 14, 2009 1:53 PM

## 2010-08-07 NOTE — Assessment & Plan Note (Signed)
Summary: ROV/ MBW   Primary Provider/Referring Provider:  Dr. Geoffry Paradise  CC:  Office visit post sinus surgery in November 2010.  History of Present Illness: 02/01/09- Rhinosinusitis, cough/bronchitis Omnaris worked very well, except during trip to [facility] where she was worse and she needed to use Afrin some. Insurance switched to nasonex. No chest tight or wheeze except in [place]. It took a week to get better back here. She has reeduced her prednisone this week from 20 to 10 mg daily RAST- Neg IgE-94 Skin test- grass, weed, tree  03/07/09- Rhinosinusitis, cough, bornchitis She had  called back after last visit, tired of coughing and asking for a CT scan, which insurance wouldn't aurthorize without prior CXR and more data.  CXR was normal.She feels she is getting sick again- seeing more "fungus" black specks in Neti pot rinse from her nose. When not on prednisone, she feels the source of cough at larynx, without wheeze, chest pain or productive cough.  Frustrated that she still sees "fungus" now after 6 months with itraconazole rinse.  Needing to stay on Advair 250 plus prednisone 10 mg to barely control cough and tightness.   August 02, 2009- Sinus disease much bette since second sinus surgery by Dr [name]. This has not stopped her cough. She also notes "thick and spongey feeling" in her throat when she pushes on suprasternal notch. She took 2 courses of omnicef and cough is no longer productive of green and yellow as it had been. Did have some fever. She is down to prednisone 20 mg.  We had ordered a CT chest last Spring, denied by [facility] so we did CXR instead.   Current Medications (verified): 1)  Diovan 160  Mg Tabs (Valsartan) .... Take One Tablet Daily 2)  Furosemide 40 Mg Tabs (Furosemide) .... Take One Tablet Daily. 3)  Klor-Con 20 Meq Pack (Potassium Chloride) .... Take One Tablet Daily. 4)  Ferus 150 Mg Caps (Polysaccharide Iron Complex) .... Take One Tablet  Daily. 5)  Advair Diskus 250-50 Mcg/dose  Misc (Fluticasone-Salmeterol) .... One Puff Twice Daily 6)  Saline Nasal Rinse .... Use Two Times A Day 7)  Mucinex Maximum Strength 1200 Mg Xr12h-Tab (Guaifenesin) .... Take One Tablet Two Times A Day 8)  Zoloft 100 Mg Tabs (Sertraline Hcl) .... Take 1 and 1/2 Tablets Daily. (150 Mg) 9)  Alprazolam 0.25 Mg Tabs (Alprazolam) .... Use As Needed 10)  Prednisone 10 Mg Tabs (Prednisone) .... Take 15mg -20mg  Daily 11)  Calcium 1200-1000 Mg-Unit Chew (Calcium Carbonate-Vit D-Min) .... Take 1 By Mouth Once Daily 12)  Multivitamins  Tabs (Multiple Vitamin) .... Take 1 By Mouth Once Daily 13)  Nasonex 50 Mcg/act  Susp (Mometasone Furoate) .Marland Kitchen.. 1 To 2 Sprays in Each Nostril Once Daily 14)  Diovan 160 Mg Tabs (Valsartan) .Marland Kitchen.. 1 Once Daily 15)  Aldactone 25 Mg Tabs (Spironolactone) .Marland Kitchen.. 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  The patient denies anorexia, fever, weight loss, weight gain, vision loss, decreased hearing, hoarseness, chest pain, syncope, dyspnea on exertion, peripheral edema, headaches, hemoptysis, abdominal pain, and severe indigestion/heartburn.    Vital Signs:  Patient profile:   55 year old female Height:      72 inches Weight:      215 pounds BMI:     29.26 O2 Sat:      98 % on Room air Pulse rate:    72 / minute BP sitting:   140 / 86  (left arm) Cuff size:   large  Vitals Entered By: Renold Genta RCP, LPN (August 02, 2009 11:22 AM)  O2 Sat at Rest %:  98% O2 Flow:  Room air CC: Office visit post sinus surgery in November 2010 Comments Medications reviewed with patient Renold Genta RCP, LPN  August 02, 2009 11:36 AM    Physical Exam  Additional Exam:  General: A/Ox3; pleasant and cooperative, NAD, tall. pleasant, weight gain SKIN: no rash, lesions, mild flusing NODES: no lymphadenopathy HEENT: [place]/AT, EOM- WNL, Conjuctivae- clear, PERRLA, TM-WNL, Nose- mucosa pale, unobstructed,  . Throat- clear and wnl, [place] II NECK: Supple w/ fair ROM, JVD- none, normal carotid impulses w/o bruits Thyroid- , no stridor CHEST: clear  to P&A, no cough rales or rhonchi now HEART: RRR, no m/g/r heard ABDOMEN:soft, not distended ZOX:WRUE, nl pulses, no edema  NEURO: Grossly intact to observation      Impression & Recommendations:  Problem # 1:  BRONCHITIS (ICD-490)  I am concerned that she may have developed a chronic bronchitis or bronchiectasis, perhaps ABPA. We will ask for CT looking for bronchieictasis. We will try to reduce her steroids to 15 mg daily and look at candidacy for Xolair injections. The following medications were removed from the medication list:    Avelox 400 Mg Tabs (Moxifloxacin hcl) .Marland Kitchen... Take one tablet by mouth once daily x 2 weeks Her updated medication list for this problem includes:    Advair Diskus 250-50 Mcg/dose Misc (Fluticasone-salmeterol) ..... One puff twice daily    Mucinex Maximum Strength 1200 Mg Xr12h-tab (Guaifenesin) .Marland Kitchen... Take one tablet two times a day  Problem # 2:  RHINOSINUSITIS, CHRONIC (ICD-473.8) She feels much improved by [name] surgery.  Medications Added to Medication List This Visit: 1)  Diovan 160 Mg Tabs (valsartan)  .... Take one tablet daily 2)  Prednisone 10 Mg Tabs (Prednisone) .... Take 15mg -20mg  daily 3)   Diovan 160 Mg Tabs (Valsartan) .Marland Kitchen.. 1 once daily 4)  Aldactone 25 Mg Tabs (Spironolactone) .Marland Kitchen.. 1 once daily  Other Orders: Est. Patient Level III (45409) TLB-CBC Platelet - w/Differential (85025-CBCD) T-IgE (Immunoglobulin E) (81191-47829) Radiology Referral (Radiology)  Patient Instructions: 1)  Please schedule a follow-up appointment in 3 weeks. 2)  Info sheet on Xolair 3)  A Chest CT WITHOUT Contrast has been recommended. Your imaging study may require preauthorization.  4)  Lab 5)  Prednisone script for 10 mg tabs. Try to reduce to 15 mg daily Prescriptions: PREDNISONE 10 MG TABS (PREDNISONE) Take 15mg -20mg  daily  #50 x 0   Entered and Authorized by:   Waymon Budge MD   Signed by:   Waymon Budge MD on 08/02/2009   Method used:   Electronically to        [facility] (retail)       [address]       [place], [place]  [id number]  Ph: [phone] or [phone]       Fax: [phone]   RxID:   [id number]

## 2010-08-07 NOTE — Letter (Signed)
Summary: [facility]  [facility]   Imported By: Sherian Rein 09/01/2009 09:28:29  _____________________________________________________________________  External Attachment:    Type:   Image     Comment:   External Document

## 2010-08-07 NOTE — Assessment & Plan Note (Signed)
Summary: xolair///kp  Nurse Visit   Allergies: No Known Drug Allergies  Medication Administration  Injection # 1:    Medication: Xolair (omalizumab) 150mg     Diagnosis: 493.00    Route: SQ    Site: R deltoid    Exp Date: 01/2013    Lot #: [id number]    Mfr: [employer]etech    Comments: 0.9 ML IN RIGHT AND LEFT ARM CHARGED [phone]    Patient tolerated injection without complications    Given by: SUSANNE FORD IN [facility]   Medication Administration  Injection # 1:    Medication: Xolair (omalizumab) 150mg     Diagnosis: 493.00    Route: SQ    Site: R deltoid    Exp Date: 01/2013    Lot #: [id number]    Mfr: Salome Spotted    Comments: 0.9 ML IN RIGHT AND LEFT ARM CHARGED [phone]    Patient tolerated injection without complications    Given by: SUSANNE FORD IN [facility]

## 2010-08-07 NOTE — Assessment & Plan Note (Signed)
Summary: follow up visit/kcw   Primary Provider/Referring Provider:  Dr. Geoffry Paradise  CC:  follow up visit-asthma; "can tell a big difference with Xolair"..  History of Present Illness: August 18, 2009- Rhinosinusitis, bronchitis, cough She reduced prednisone from 20 mg, now down to 10 mg daily. With reduction she notices minimal occasional chest tightness, and cough is virtually gone. She had f/u with Dr [name] who questioned GERD and put her on omeprazole. We discussed reflux measures.She denies heartburn. CT - minor biapical densities, likely scar, but without obvious chronic bronchitis/ broncheictasis. CBC-WNL, Eos 0.9% IgE-IgE elevated 142.5  Nov 28, 2009 Rhinosinusitis, bronchitis, cough..............................Marland Kitchenhere with friend Dr [name] has her doing nasal nebs with vancomycin, saline and nasonex two times a day.for recurrent sinusitis. Saw Dr Sandy Salaam at [facility]  Pulmonary- labs scanned. IgE high/ 1200 range. PFT with Dr Ramond Dial was substantially lower than ours last year. ? Is she geting worse? 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 Oral antibiotics don't do much and she needs about 40 mg prednisone to keep symptoms suppressed. Had bone density check. We discussed  observation for improvemnt with 2 week beach vacation in June - would this get her away from an environmental trigger? She has been cleared to start Xolair.  December 22, 2009- Rhinosinusitis, bronchitis/ Xolair, cough.............................daughter here Cough and nasal discharge are now white, which is better. After 2 Xolair injections she notes cough is much better. Still some head congestion. Continues nasal vancomycin nebs. Going to beach for 2 weeks. Her office building is going to be checked for mold.    Asthma History    Asthma  Control Assessment:    Age range: 12+ years    Symptoms: >2 days/week    Nighttime Awakenings: 0-2/month    Interferes w/ normal activity: no limitations    SABA use (not for EIB): 0-2 days/week    Asthma Control Assessment: Not Well Controlled   Preventive Screening-Counseling & Management  Alcohol-Tobacco     Smoking Status: never  Current Medications (verified): 1)  Furosemide 40 Mg Tabs (Furosemide) .... Take One Tablet Daily. 2)  Klor-Con 20 Meq Pack (Potassium Chloride) .... Take One Tablet Daily. 3)  Ferus 150 Mg Caps (Polysaccharide Iron Complex) .... Take One Tablet Daily. 4)  Advair Diskus 250-50 Mcg/dose  Misc (Fluticasone-Salmeterol) .... One Puff Twice Daily 5)  Saline Nasal Rinse .... Use Two Times A Day 6)  Zoloft 100 Mg Tabs (Sertraline Hcl) .... Take 1 and 1/2 Tablets Daily. (150 Mg) 7)  Alprazolam 0.25 Mg Tabs (Alprazolam) .... Use As Needed 8)  Calcium 1200-1000 Mg-Unit Chew (Calcium Carbonate-Vit D-Min) .... Take 1 By Mouth Once Daily 9)  Multivitamins  Tabs (Multiple Vitamin) .... Take 1 By Mouth Once Daily 10)  Diovan 160 Mg Tabs (Valsartan) .Marland Kitchen.. 1 Once Daily 11)  Aldactone 25 Mg Tabs (Spironolactone) .Marland Kitchen.. 1 Once Daily 12)  Omeprazole 40 Mg Cpdr (Omeprazole) .... Take 1 By Mouth Once Daily 13)  Xolair 150 Mg Solr (Omalizumab) .... 225 Mg [place] Q 2 Weeks 14)  Sinus Neb .... Vancomycin.saline.nasonex Two Times A Day 15)  Epipen 0.3 Mg/0.2ml Devi (Epinephrine) .... For Severe Allergic Reaction  Allergies (verified): No Known Drug Allergies  Past History:  Past Medical History: Last updated: 02/02/2009 Chronic sinusitis with polyps- ok ASA- Skin Test 02/02/09 Hypertension  Past Surgical History: Last updated: 08/02/2009  Endoscopic sinus surgery 10/09- Dr Pollyann Kennedy, Nov 2010- Dr [name] Tubal ligation  Family History: Last updated: 03/07/2009 Heart Disease - father, brother Clotting Disorders - mother, sister Mother had DVT/PE after ankle fx Cancer -  sister; melanoma Stroke - father  Social History: Last updated: 11/11/2008 Married children, grandchild 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  The patient denies shortness of breath with activity, shortness of breath at rest, coughing up blood, chest pain, irregular heartbeats, acid heartburn, indigestion, loss of appetite, weight change, abdominal pain, difficulty swallowing, sore throat, tooth/dental problems, and sneezing.    Vital Signs:  Patient profile:   55 year old female Height:      72 inches Weight:      222 pounds BMI:     30.22 O2 Sat:      98 % on Room air Pulse rate:   95 / minute BP sitting:   126 / 78  (left arm) Cuff size:   regular  Vitals Entered By: [name] CMA (December 22, 2009 4:27 PM)  O2 Flow:  Room air CC: follow up visit-asthma; "can tell a big difference with Xolair".   Physical Exam  Additional Exam:  General: A/Ox3; pleasant and cooperative, NAD, tall. Has goned weight since we first met.,  SKIN: no rash, lesions,  NODES: no lymphadenopathy HEENT: [place]/AT, EOM- WNL, Conjuctivae- clear, PERRLA, Periorbital edema-  TM-WNL, Nose- mucosa pale, unobstructed,  . Throat- clear and wnl, Mallampati  II,  NECK: Supple w/ fair ROM, JVD- none, normal carotid impulses w/o bruits Thyroid- , no stridor CHEST: clear to P&A- no wheeze HEART: RRR, no m/g/r heard ABDOMEN:soft, not distended ZOX:WRUE, nl pulses, no edema  NEURO: Grossly intact to observation      Impression & Recommendations:  Problem # 1:  ASTHMA, EXTRINSIC (ICD-493.00) Very hopeful progress. Xolair seems to be helping a lot on initial experience. Trip to beach is also likely to help. We will take the opportunity to try reducing prednisone  some.  Problem # 2:  RHINOSINUSITIS, CHRONIC (ICD-473.8) She will continue the vancomycin neb for now- open ended.  Other Orders: Est. Patient Level IV (45409)  Patient Instructions: 1)  Schedule return in 6 weeks- earlier if needed 2)  Try reducing the prednisone to 20 mg alternating with 10 mg every other day x 2 weeks. 3)   If stable, then reduce to 10 mg daily and hold there till you see me, or call to discuss.

## 2010-08-07 NOTE — Assessment & Plan Note (Signed)
Summary: xolair///kp  Nurse Visit   Allergies: No Known Drug Allergies  Medication Administration  Injection # 1:    Medication: Xolair (omalizumab) 150mg     Diagnosis: 493.00    Route: SQ    Site: R deltoid    Exp Date: 01/2013    Lot #: [id number]    Mfr: [employer]    Comments: 1.2 ML IN RIGHT AND LEFT ARM CHARGED [id number] AND  [id number]     Patient tolerated injection without complications    Given by: TAMMY SCOTT IN [facility]   Medication Administration  Injection # 1:    Medication: Xolair (omalizumab) 150mg     Diagnosis: 493.00    Route: SQ    Site: R deltoid    Exp Date: 01/2013    Lot #: [id number]    Mfr: [employer]    Comments: 1.2 ML IN RIGHT AND LEFT ARM CHARGED [id number] AND  [id number]     Patient tolerated injection without complications    Given by: TAMMY SCOTT IN [facility]

## 2010-08-07 NOTE — Letter (Signed)
Summary: [facility]  [facility]   Imported By: Lester Wardell 07/14/2009 08:29:57  _____________________________________________________________________  External Attachment:    Type:   Image     Comment:   External Document

## 2010-08-07 NOTE — Progress Notes (Signed)
Summary: Kelli Fleming. wanting to know lab results  Phone Note Call from Patient   Caller: Patient Reason for Call: Lab or Test Results Summary of Call: Had sinus surgery on 05/16/10 by Dr. Osborn Coho.  Packing was removed 05/23/10.  Kelli Fleming. is feeling pretty good after the surgery.  Kelli Fleming. PCP is Dr. Quillian Quince @ [facility].  Shared lab reports with the Kelli Fleming. which were normal.  Kelli Fleming verbalized understanding. Jennet Maduro RN  May 24, 2010 2:05 PM

## 2010-08-07 NOTE — Assessment & Plan Note (Signed)
Summary: xolair/apc  Nurse Visit   Allergies: No Known Drug Allergies  Medication Administration  Injection # 1:    Medication: Xolair (omalizumab) 150mg     Diagnosis: ASTHMA, EXTRINSIC (ICD-493.00)    Route: IM    Site: R deltoid    Exp Date: 02/05/2013    Lot #: [id number]    Mfr: [employer]etech    Comments: xolair 225, 60 units, 0.9 ml x 1 in Right deltoid, and 0.9 ml in Left deltoid. Pt waited 30 mins.    Given by: Dimas Millin, allergy tech  Orders Added: 1)  Administration xolair injection [phone]   Medication Administration  Injection # 1:    Medication: Xolair (omalizumab) 150mg     Diagnosis: ASTHMA, EXTRINSIC (ICD-493.00)    Route: IM    Site: R deltoid    Exp Date: 02/05/2013    Lot #: [id number]    Mfr: [employer]    Comments: xolair 225, 60 units, 0.9 ml x 1 in Right deltoid, and 0.9 ml in Left deltoid. Pt waited 30 mins.    Given by: Dimas Millin, allergy tech  Orders Added: 1)  Administration xolair injection [phone]

## 2010-08-07 NOTE — Letter (Signed)
Summary: [facility]  [facility]   Imported By: Sherian Rein 05/19/2010 11:38:04  _____________________________________________________________________  External Attachment:    Type:   Image     Comment:   External Document

## 2010-08-07 NOTE — Medication Information (Signed)
Summary: Prior Auth/medco  Prior Auth/[employer]   Imported By: Lester [place] 09/07/2009 10:58:36  _____________________________________________________________________  External Attachment:    Type:   Image     Comment:   External Document

## 2010-08-07 NOTE — Assessment & Plan Note (Signed)
Summary: cough//lmr   Visit Type:  Follow-up Primary Provider/Referring Provider:  Dr. Geoffry Paradise  CC:  deep cough x 5 days with wheezing--sob and chest tightnessx 3-4 wks.  History of Present Illness:  Nov 28, 2009 Rhinosinusitis, bronchitis, cough..............................Marland Kitchenhere with friend Dr [name] has her doing nasal nebs with vancomycin, saline and nasonex two times a day.for recurrent sinusitis. Saw Dr Sandy Salaam at [facility]  Pulmonary- labs scanned. IgE high/ 1200 range. PFT with Dr Ramond Dial was substantially lower than ours last year. ? Is she geting worse? 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 Oral antibiotics don't do much and she needs about 40 mg prednisone to keep symptoms suppressed. Had bone density check. We discussed  observation for improvemnt with 2 week beach vacation in June - would this get her away from an environmental trigger? She has been cleared to start Xolair.  December 22, 2009- Rhinosinusitis, bronchitis/ Xolair, cough.............................daughter here Cough and nasal discharge are now white, which is better. After 2 Xolair injections she notes cough is much better. Still some head congestion. Continues nasal vancomycin nebs. Going to beach for 2 weeks. Her office building is going to be checked for mold.  March 19, 2010- Rhinosinusitis, bronchitis/ Xolair/ steroids, cough Has been down to 2.5 mg prednisone for last 2 weeks. For last week  has been coughing more. ENT put her on omeprazole. Raspy wheeze seems to come from her throat esp while lying down. Harsh cough. Nonproductive and not itching. Not sure if Xolair was doing anything in retrospect, now that prednisone is lower. She feels "cellophane" sensation over windpipe at level of larynx. Feels sore at larynx when she swallows without  hangup. She feels there is a mechanical event at the level of larynx and we discussed trying to better image this.  Asthma History    Initial Asthma Severity Rating:    Age range: 12+ years    Symptoms: daily    Nighttime Awakenings: 0-2/month    Interferes w/ normal activity: no limitations    SABA use (not for EIB): >2 days/week but not >1X/day    Asthma Severity Assessment: Moderate Persistent   Preventive Screening-Counseling & Management  Alcohol-Tobacco     Smoking Status: never  Medications Prior to Update: 1)  Furosemide 40 Mg Tabs (Furosemide) .... Take One Tablet Daily. 2)  Klor-Con 20 Meq Pack (Potassium Chloride) .... Take One Tablet Daily. 3)  Ferus 150 Mg Caps (Polysaccharide Iron Complex) .... Take One Tablet Daily. 4)  Advair Diskus 250-50 Mcg/dose  Misc (Fluticasone-Salmeterol) .... One Puff Twice Daily 5)  Saline Nasal Rinse .... Use Two Times A Day 6)  Zoloft 100 Mg Tabs (Sertraline Hcl) .... Take 1 and 1/2 Tablets Daily. (150 Mg) 7)  Alprazolam 0.25 Mg Tabs (Alprazolam) .... Use As Needed 8)  Calcium 1200-1000 Mg-Unit Chew (Calcium Carbonate-Vit D-Min) .... Take 1 By Mouth Once Daily 9)  Multivitamins  Tabs (Multiple Vitamin) .... Take 1 By Mouth Once Daily 10)  Diovan 160 Mg Tabs (Valsartan) .Marland Kitchen.. 1 Once Daily 11)  Aldactone 25 Mg Tabs (Spironolactone) .Marland Kitchen.. 1 Once Daily 12)  Omeprazole 40 Mg Cpdr (Omeprazole) .... Take 1 By Mouth Once Daily 13)  Xolair 150 Mg Solr (Omalizumab) .... 225 Mg Vayas Q 2 Weeks 14)  Sinus Neb .... Vancomycin.saline.nasonex Two Times A Day 15)  Epipen 0.3 Mg/0.29ml Devi (Epinephrine) .Marland KitchenMarland KitchenMarland Kitchen  For Severe Allergic Reaction  Allergies (verified): No Known Drug Allergies  Comments:  Nurse/Medical Assistant: The patient's medications and allergies were reviewed with the patient and were updated in the Medication and Allergy Lists.  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  The patient denies shortness of breath at rest, productive cough, coughing up blood, chest pain, irregular heartbeats, acid heartburn, indigestion, loss of appetite, weight change, abdominal pain, difficulty swallowing, sore throat, tooth/dental problems, headaches, sneezing, itching, ear ache, hand/feet swelling, joint stiffness or pain, rash, change in color of mucus, and fever.    Vital Signs:  Patient profile:   55 year old female Height:      72 inches Weight:      200 pounds BMI:     27.22 O2 Sat:      96 % on Room air Pulse rate:   104 / minute BP sitting:   144 / 90  (left arm) Cuff size:   regular  Vitals Entered By: [name] CMA (March 19, 2010 4:26 PM)  O2 Sat at Rest %:  96 O2 Flow:  Room air CC: deep cough x 5 days with wheezing--sob and chest tightnessx 3-4 wks Is Patient Diabetic? No Pain Assessment Patient in pain? no      Comments meds updated today with pt [name] CMA  March 19, 2010 4:27 PM    Physical Exam  Additional Exam:  General: A/Ox3; pleasant and cooperative, NAD, tall.  SKIN: no rash, lesions,  NODES: no lymphadenopathy HEENT: Berthold/AT, EOM- WNL, Conjuctivae- clear, PERRLA, Periorbital edema-  TM-WNL, Nose- mucosa pale, unobstructed,  . Throat- clear and wnl,  Mallampati  II, voice normal, no stridor NECK: Supple w/ fair ROM, JVD- none, normal carotid impulses w/o bruits Thyroid- ,  CHEST: clear to P&A- no wheeze, but loose cough with deep breath. HEART: RRR, no m/g/r heard ABDOMEN:soft, not distended ZOX:WRUE, nl pulses, no edema  NEURO: Grossly intact to observation      Impression & Recommendations:  Problem # 1:  EXTRINSIC ASTHMA, UNSPECIFIED (ICD-493.00) Question a laryngo tracheitis. I can try to image this with contrast CT to exclude goiter or diverticulum. I will add a stronger inhaled steroid instead of Advair, and hold her present low systemic prednisone dose.  She keeps a bronchitic cough, but not worse. Symptoms seem to be increasing now as she gets very low on maintenance prednisone. We have again discussed steroid side effects. I will try again to support her more with inhaled steroids. Once stable I will try to press on with systemic steroid withdrawal  Medications Added to Medication List This Visit: 1)  Dulera 200-5 Mcg/act Aero (Mometasone furo-formoterol fum) .... 2 puffs and rinse well, twice daily instead of advair--sample 2)  Prednisone 2.5 Mg Tabs (Prednisone) .... Take 1 tablet by mouth once a day  Other Orders: Est. Patient Level IV (21308) Radiology Referral (Radiology) Administration xolair injection [phone])  Patient Instructions: 1)  Please schedule a follow-up appointment in 3 weeks. 2)  Sample Dulera 200-5; 3)        2 puffs and rinse well twice daily. use this instead of Advair for now. 4)  See [facility] to schedule CT of neck 5)  Continue prednisone at 2.5 mg daily   Immunization History:  Influenza Immunization History:    Influenza:  historical (04/26/2009)    Medication Administration  Injection # 1:    Medication: Xolair (omalizumab) 150mg     Diagnosis: EXTRINSIC ASTHMA, UNSPECIFIED (ICD-493.00)    Route: SQ    Site: L deltoid    Exp Date: 02/2013    Lot #: [id number]    Mfr: Mendel Ryder     Comments: Xolair 225mg  0.50ml x 1 in Left and Right Deltoid. Pt waited and saw dr for [facility] after injections.    Patient tolerated injection without complications    Given by: [name] CMA (March 19, 2010 5:43 PM)  Orders Added: 1)  Est. Patient Level IV [28413] 2)  Radiology Referral [Radiology] 3)  Administration xolair injection [phone]

## 2010-08-09 NOTE — Letter (Signed)
Summary: [facility]  [facility]   Imported By: Lester Presidio 07/12/2010 07:21:07  _____________________________________________________________________  External Attachment:    Type:   Image     Comment:   External Document

## 2010-08-09 NOTE — Assessment & Plan Note (Signed)
Summary: rov/jd   Primary Provider/Referring Provider:  Dr. Geoffry Paradise  CC:  Follow up visit-discuss sinus after surgery-? to keep Rx for Epipen and ProAir HFA.Kelli Fleming  History of Present Illness:  April 13, 2010- Rhinosinusitis, bronchitis/ Xolair/ steroids, cough...........daughter here cc: 3 week follow up visit-sinus infection again. Sinusitis and polyps are back. Saw Dr [name] yesterday. He recultured and will give antibioitic based on outcome. . 5 years ago got GI parasite in [place], treated by Dr [name]. All her respiratory problems began after she cleaned a dirty apartment with a lot of cats. She now asks if I can check for toxoplasmosis. We reviewed her CT sinus, which was broadly done and significant for chronic sinusitis. She is snoring. We reviewed her [facility] immunology studies.  Off prednisone now 2.5 weeks as isntructed. She stopped Xolair as ineffective.  I spoke with Dr [name] while she was here. He has considered retry of nasal aerosol antibiotics based on pending culture. She reminds me that she was treated with prolonged nasal antibiotics from [facility] in the past.   July 18, 2010-  Rhinosinusitis, bronchitis/ Xolair/ steroids, cough...........daughter here Nurse-CC: Follow up visit-discuss sinus after surgery-? to keep Rx for Epipen and ProAir HFA. She finally reports feeling well. She had a third sinus surgery by Dr [name] and thinks that helped. She had a lot of thick retained secretion then. Within three days as she weaned herself on prednisone and stopped nasal neb treatments, she noted rapid return of cough and nasal congestion so she is back on prednisone and the nasal nebs. Never used Proair or Epipen. Maintaining prednisone 20 mg daily.  We discussed steroid dose and taper. Occasionally blows from nose after Neti pot- yellow green.  Aspergillus serologies were Neg for Dr Maurice March on review of Infectious Disease notes.[name] more than Advair.   Asthma  History    Asthma Control Assessment:    Age range: 12+ years    Symptoms: 0-2 days/week    Nighttime Awakenings: 0-2/month    Interferes w/ normal activity: no limitations    SABA use (not for EIB): 0-2 days/week    Asthma Control Assessment: Well Controlled   Preventive Screening-Counseling & Management  Alcohol-Tobacco     Smoking Status: never  Current Medications (verified): 1)  Furosemide 40 Mg Tabs (Furosemide) .... Take One Tablet Daily. 2)  Klor-Con 20 Meq Pack (Potassium Chloride) .... Take One Tablet Daily. 3)  Ferus 150 Mg Caps (Polysaccharide Iron Complex) .... Take One Tablet Daily. 4)  Dulera 200-5 Mcg/act Aero (Mometasone Furo-Formoterol Fum) .... 2 Puffs and Rinse Well, Twice Daily Instead of Advair--Sample 5)  Saline Nasal Rinse .... Use Two Times A Day 6)  Zoloft 100 Mg Tabs (Sertraline Hcl) .... Take 1 and 1/2 Tablets Daily. (150 Mg) 7)  Alprazolam 0.25 Mg Tabs (Alprazolam) .... Use As Needed 8)  Calcium 1200-1000 Mg-Unit Chew (Calcium Carbonate-Vit D-Min) .... Take 1 By Mouth Once Daily 9)  Multivitamins  Tabs (Multiple Vitamin) .... Take 1 By Mouth Once Daily 10)  Diovan 160 Mg Tabs (Valsartan) .Kelli Fleming.. 1 Once Daily 11)  Aldactone 25 Mg Tabs (Spironolactone) .Kelli Fleming.. 1 Once Daily 12)  Omeprazole 40 Mg Cpdr (Omeprazole) .... Take 1 By Mouth Once Daily 13)  Sinus Neb .... 2 Cc Mometasone 0.6mg saline.nasonex Two Times A Day 14)  Epipen 0.3 Mg/0.67ml Devi (Epinephrine) .... For Severe Allergic Reaction 15)  Prednisone 10 Mg Tabs (Prednisone) .... Take 15mg  or 20mg  Daily 16)  Vitamin D 1000 Unit Tabs (Cholecalciferol) .Kelli KitchenMarland KitchenMarland Fleming  Take 1 By Mouth Once Daily 17)  Align  Caps (Probiotic Product) .... Take 1 By Mouth Once Daily  Allergies (verified): No Known Drug Allergies  Past History:  Past Medical History: Last updated: 03/19/2010 Chronic sinusitis with polyps- ok ASA- Skin Test 02/02/09 Asthma Bronchitis Hypertension  Past Surgical History: Last updated:  08/02/2009 Endoscopic sinus surgery 10/09- Dr Pollyann Kennedy, Nov 2010- Dr [name] Tubal ligation  Family History: Last updated: 03/07/2009 Heart Disease - father, brother Clotting Disorders - mother, sister Mother had DVT/PE after ankle fx Cancer - sister; melanoma Stroke - father  Social History: Last updated: 11/11/2008 Married children, grandchild 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    Vital Signs:  Patient profile:   55 year old female Height:      72 inches Weight:      219.13 pounds BMI:     29.83 O2 Sat:      99 % on Room air Pulse rate:   98 / minute BP sitting:   134 / 74  (left arm) Cuff size:   regular  Vitals Entered By: [name] CMA (July 18, 2010 9:10 AM)  O2 Flow:  Room air CC: Follow up visit-discuss sinus after surgery-? to keep Rx for Epipen and ProAir HFA.   Physical Exam  Additional Exam:  General: A/Ox3; pleasant and cooperative, NAD, tall.  SKIN: no rash, lesions,  NODES: no lymphadenopathy HEENT: /AT, EOM- WNL, Conjuctivae- clear, PERRLA, Periorbital edema-  TM-WNL, Nose- mucosa pale, unobstructed,  . Throat- clear and wnl, Mallampati  II, voice normal, no stridor NECK: Supple w/ fair ROM, JVD- none, normal carotid impulses w/o bruits Thyroid- ,  CHEST: clear to P&A- no wheeze, no cough. HEART: RRR, no m/g/r heard ABDOMEN:soft, not distended ZOX:WRUE, nl pulses, no edema  NEURO: Grossly intact to observation      Impression & Recommendations:  Problem # 1:   EXTRINSIC ASTHMA, UNSPECIFIED (ICD-493.00) As she tapers prednisone and Dulera a lttle, we will watch for flare. I also want to see how she handles seasonal changes. For now I suggested she adjust prednisone in increments of 5 mg, andticipating she will hold around 10-15 mg daily.   Problem # 2:  RHINOSINUSITIS, CHRONIC (ICD-473.8) No infection identified and no specific allergy intervention helped enough. She has had a poorly defined inflammatory process that might have been initially mold-exposure induced, but probably not an IgE process ongoing. We discussed bone denisty assessment and she can discuss this w/ Dr [name] next week.   Problem # 3:  BRONCHITIS (ICD-490)  Discussion included in above comments. Her updated medication list for this problem includes:    Dulera 100-5 Mcg/act Aero (Mometasone furo-formoterol fum) .Kelli Fleming... 2 puffs and rinse mouth, twice daily taper as tolerated  Medications Added to Medication List This Visit: 1)  Dulera 100-5 Mcg/act Aero (Mometasone furo-formoterol fum) .... 2 puffs and rinse mouth, twice daily taper as tolerated 2)  Sinus Neb  .... 2 cc mometasone 0.6mg saline.nasonex two times a day 3)  Prednisone 5 Mg Tabs (Prednisone) .... 4 daily as directed 4)  Vitamin D 1000 Unit Tabs (Cholecalciferol) .... Take  1 by mouth once daily 5)  Align Caps (Probiotic product) .... Take 1 by mouth once daily  Other Orders: Est. Patient Level IV (29562) Pneumococcal Vaccine (13086) Admin 1st Vaccine (57846)  Patient Instructions: 1)  Please schedule a follow-up appointment in 4 months. Please call sooner as needed. 2)  Prednisone- changing to 5 mg tabs. Hold at 20 mg daily till sure you are stable. You can reduce by 2.5 to 5 mg increments, slowly- perhaps a week at a time, to the lowest dose that maintains you. You may need to take a little extra some days. 3)  Dulera- changed to the 100-5 strength 4)  Start at 2 puffs and rinse, twice daily. You probably should  continue at least 1 puff every day, but you may find that over time you can get away with skipping individual puffs. Again, on some days you might feel need to go back up by a puff or two per day. 5)  ccDr [name], Dr [name], Dr Maurice March  Prescriptions: PREDNISONE 5 MG TABS (PREDNISONE) 4 daily as directed  #120 x 2   Entered and Authorized by:   Waymon Budge MD   Signed by:   Waymon Budge MD on 07/18/2010   Method used:   Electronically to        [facility] (retail)       [address]       [place], [place]  [id number]       Ph: [phone] or [phone]       Fax: [phone]   RxID:   [id number] DULERA 100-5 MCG/ACT AERO (MOMETASONE FURO-FORMOTEROL FUM) 2 puffs and rinse mouth, twice daily taper as tolerated  #1 x prn   Entered and Authorized by:   Waymon Budge MD   Signed by:   Waymon Budge MD on 07/18/2010   Method used:   Electronically to        [facility] (retail)       [address]       [place], [place]  [address]       Ph: [phone] or [phone]       Fax: [phone]   RxID:   [id number]    Immunizations Administered:  Pneumonia Vaccine:    Vaccine Type: Pneumovax    Site: left deltoid    Mfr: Merck    Dose: 0.5 ml    Route: IM    Given by: [name] CMA    Exp. Date: 11/15/2011    Lot #: [id number]    VIS given: 06/12/09 version given July 18, 2010.

## 2010-08-23 NOTE — Letter (Signed)
Summary: Osborn Coho MD/[facility]  Osborn Coho MD/[facility]   Imported By: Lester [place] 08/14/2010 09:49:26  _____________________________________________________________________  External Attachment:    Type:   Image     Comment:   External Document

## 2010-11-13 ENCOUNTER — Encounter: Payer: Self-pay | Admitting: Internal Medicine

## 2010-11-14 ENCOUNTER — Encounter: Payer: Self-pay | Admitting: Internal Medicine

## 2010-11-14 ENCOUNTER — Ambulatory Visit (INDEPENDENT_AMBULATORY_CARE_PROVIDER_SITE_OTHER): Payer: 59 | Admitting: Internal Medicine

## 2010-11-14 VITALS — BP 136/72 | HR 106 | Ht 72.0 in | Wt 213.8 lb

## 2010-11-14 DIAGNOSIS — J328 Other chronic sinusitis: Secondary | ICD-10-CM

## 2010-11-14 DIAGNOSIS — J45909 Unspecified asthma, uncomplicated: Secondary | ICD-10-CM

## 2010-11-14 MED ORDER — ALBUTEROL SULFATE HFA 108 (90 BASE) MCG/ACT IN AERS: 2.0000 | INHALATION_SPRAY | RESPIRATORY_TRACT | Status: DC | PRN

## 2010-11-14 NOTE — Assessment & Plan Note (Addendum)
Lower airway inflammation followed and was related to her sinus disease.  Good control now. We discussed the definition and recognition of asthma. I am not sure she has had any recent asthma. We did agree to let her have a rescue inhaler to use if needed.

## 2010-11-14 NOTE — Patient Instructions (Signed)
Script for a rescue albuterol bronchodilator to use in case of wheeze or chest tightness.

## 2010-11-14 NOTE — Progress Notes (Signed)
  Subjective:    Patient ID: Kelli Fleming, female    DOB: 29-Sep-1955, 55 y.o.   MRN: [id number]  HPI 11/14/10- 52 yoF never smoker with chronic rhinosinusitis/ surgery, asthmatic bronchitis.  Last here January 11, 20012. She has been off prednisone after slow taper, now off 5 days. No need for [facility] in 2 months. 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 She continues nasal antibiotics under [name] good care.   Review of Systems Constitutional:   No weight loss, night sweats, Fevers, chills, fatigue, lassitude. HEENT:   No headaches,  Difficulty swallowing,  Tooth/dental problems,  Sore throat,                No sneezing, itching, ear ache,  CV:  No chest pain,  Orthopnea, PND, swelling in lower extremities, anasarca, dizziness, palpitations  GI  No heartburn, indigestion, abdominal pain, nausea, vomiting, diarrhea, change in bowel habits, loss of appetite  Resp: No shortness of breath with exertion or at rest.  No excess mucus, no productive cough,  No non-productive cough,  No coughing up of blood.  No change in color of mucus.   Skin: no rash or lesions.  GU: no dysuria, change in color of urine, no urgency or frequency.  No flank pain.  MS:  No joint pain or swelling.  No decreased range of motion.  No back pain.  Psych:  No change in mood or affect. No depression or anxiety.  No memory loss.      Objective:   Physical Exam General- Alert, Oriented, Affect-appropriate, Distress- none acute  Skin- rash-none, lesions- none, excoriation- none  Lymphadenopathy- none  Head- atraumatic  Eyes- Gross vision intact, PERRLA, conjunctivae clear secretions   Periorbital edema  Ears- Normal- Hearing, canals, Tm   Nose- Clear, No- Septal dev, mucus, polyps, erosion, perforation   Throat- Mallampati II , mucosa clear , drainage- none, tonsils- atrophic  Neck- flexible , trachea midline, no stridor , thyroid nl, carotid no bruit  Chest -  symmetrical excursion , unlabored     Heart/CV- RRR , no murmur , no gallop  , no rub, nl s1 s2                     - JVD- none , edema- none, stasis changes- none, varices- none     Lung- clear to P&A, wheeze- none, cough- none , dullness-none, rub- none     Chest wall-   Abd- tender-no, distended-no, bowel sounds-present, HSM- no  Br/ Gen/ Rectal- Not done, not indicated  Extrem- cyanosis- none, clubbing, none, atrophy- none, strength- nl  Neuro- grossly intact to observation         Assessment & Plan:

## 2010-11-18 ENCOUNTER — Encounter: Payer: Self-pay | Admitting: Internal Medicine

## 2010-11-18 ENCOUNTER — Other Ambulatory Visit: Payer: Self-pay | Admitting: Internal Medicine

## 2010-11-19 ENCOUNTER — Telehealth: Payer: Self-pay | Admitting: Internal Medicine

## 2010-11-19 NOTE — Telephone Encounter (Signed)
Pt had to restart Prednisone daily due to increased cough. She says the cough has improved since back on daily Prednisone therapy. Will forward to CDY as FYI.

## 2010-11-26 NOTE — Telephone Encounter (Signed)
Noted by CY.  

## 2011-01-20 ENCOUNTER — Other Ambulatory Visit: Payer: Self-pay | Admitting: Internal Medicine

## 2011-05-22 ENCOUNTER — Ambulatory Visit (INDEPENDENT_AMBULATORY_CARE_PROVIDER_SITE_OTHER): Payer: 59 | Admitting: Internal Medicine

## 2011-05-22 ENCOUNTER — Encounter: Payer: Self-pay | Admitting: Internal Medicine

## 2011-05-22 VITALS — BP 130/70 | HR 112 | Ht 72.0 in | Wt 192.2 lb

## 2011-05-22 DIAGNOSIS — J328 Other chronic sinusitis: Secondary | ICD-10-CM

## 2011-05-22 DIAGNOSIS — J45909 Unspecified asthma, uncomplicated: Secondary | ICD-10-CM

## 2011-05-22 MED ORDER — PREDNISONE 1 MG PO TABS: ORAL_TABLET | ORAL | Status: DC

## 2011-05-22 NOTE — Patient Instructions (Addendum)
Script sent for prednisone 1 mg, to allow slow taper as tolerated. Reduce by 1 mg every 2 weeks.  Go back up as needed and please call as needed.  Try using the Proair rescue inhaler as first choice if you feel a little tight. If you need to use it often or it doesn't satisfy, please let me know.

## 2011-05-22 NOTE — Progress Notes (Signed)
Patient ID: Kelli Fleming, female    DOB: July 27, 1955, 55 y.o.   MRN: [id number]  HPI 11/14/10- 55 yoF never smoker with chronic rhinosinusitis/ surgery, asthmatic bronchitis.  Last here January 11, 20012. She has been off prednisone after slow taper, now off 5 days. No need for [facility] in 2 months. 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 She continues nasal antibiotics under Dr Thurmon Fleming good care.  05/22/11-  55 yoF never smoker with chronic rhinosinusitis/ surgery, asthmatic bronchitis.  Had flu vaccine. Continues to work with Kelli Fleming. Today she reports feeling very well, "the best in 3 years". She has been taking prednisone 5 mg daily since the summer. Kelli Fleming suggested caution with any withdrawal from her recent exam was good. She continues daily Claritin and Dulera without need for rescue inhaler.    Review of Systems See HPI Constitutional:   No-   weight loss, night sweats, fevers, chills, fatigue, lassitude. HEENT:   No-  headaches, difficulty swallowing, tooth/dental problems, sore throat,       No-  sneezing, itching, ear ache, nasal congestion, post nasal drip,  CV:  No-   chest pain, orthopnea, PND, swelling in lower extremities, anasarca,                                  dizziness, palpitations Resp: No-   shortness of breath with exertion or at rest.              No-   productive cough,  No non-productive cough,  No- coughing up of blood.              No-   change in color of mucus.  No- wheezing.   Skin: No-   rash or lesions. GI:  No-   heartburn, indigestion, abdominal pain, nausea, vomiting, diarrhea,                 change in bowel habits, loss of appetite GU: No-   dysuria, change in color of urine, no urgency or frequency.  No- flank pain. MS:  No-   joint pain or swelling.  No- decreased range of motion.  No- back pain. Neuro-     nothing unusual Psych:  No- change in mood or affect. No depression or anxiety.  No memory  loss.   Objective:   Physical Exam General- Alert, Oriented, Affect-appropriate, Distress- none acute; able t lose some weight. Skin- rash-none, lesions- none, excoriation- none Lymphadenopathy- none Head- atraumatic            Eyes- Gross vision intact, PERRLA, conjunctivae clear secretions            Ears- Hearing, canals-normal            Nose- Clear, no-Septal dev, mucus, polyps, erosion, perforation             Throat- Mallampati II , mucosa clear , drainage- none, tonsils- atrophic Neck- flexible , trachea midline, no stridor , thyroid nl, carotid no bruit Chest - symmetrical excursion , unlabored           Heart/CV- RRR , no murmur , no gallop  , no rub, nl s1 s2                           - JVD- none , edema- none, stasis changes- none,  varices- none           Lung- clear to P&A, wheeze- none, cough- none , dullness-none, rub- none           Chest wall-  Abd- tender-no, distended-no, bowel sounds-present, HSM- no Br/ Gen/ Rectal- Not done, not indicated Extrem- cyanosis- none, clubbing, none, atrophy- none, strength- nl Neuro- grossly intact to observation

## 2011-05-26 NOTE — Assessment & Plan Note (Signed)
The best descriptor now might be "mild intermittent asthma". I explained how steroids work, emphasizing that changes should move slowly so that we have time to see deterioration starting before with move to far. She is going to try using Proair as a rescue inhaler, instead of Dulera. We're going to change to 1 mg prednisone pills and try dropping by 1 mg every 2 weeks if tolerated.

## 2011-05-26 NOTE — Assessment & Plan Note (Signed)
She does not feel any sinus inflammation currently. We assumes the problems she has had with her upper airway are the same as those in her lower airway.

## 2011-07-25 ENCOUNTER — Other Ambulatory Visit: Payer: Self-pay | Admitting: Internal Medicine

## 2011-08-12 ENCOUNTER — Ambulatory Visit
Admission: RE | Admit: 2011-08-12 | Discharge: 2011-08-12 | Disposition: A | Discharge status: Home / Self Care | Payer: Commercial Managed Care - PPO | Source: Ambulatory Visit | Attending: Internal Medicine | Admitting: Internal Medicine

## 2011-08-12 ENCOUNTER — Other Ambulatory Visit: Payer: Self-pay | Admitting: Internal Medicine

## 2011-08-12 DIAGNOSIS — E041 Nontoxic single thyroid nodule: Secondary | ICD-10-CM

## 2011-11-05 ENCOUNTER — Other Ambulatory Visit: Payer: Self-pay | Admitting: Internal Medicine

## 2011-11-27 ENCOUNTER — Ambulatory Visit: Payer: 59 | Admitting: Internal Medicine

## 2011-12-16 ENCOUNTER — Ambulatory Visit (INDEPENDENT_AMBULATORY_CARE_PROVIDER_SITE_OTHER): Payer: Commercial Managed Care - PPO | Admitting: Internal Medicine

## 2011-12-16 ENCOUNTER — Encounter: Payer: Self-pay | Admitting: Internal Medicine

## 2011-12-16 VITALS — BP 144/82 | HR 97 | Ht 72.0 in | Wt 199.6 lb

## 2011-12-16 DIAGNOSIS — J45909 Unspecified asthma, uncomplicated: Secondary | ICD-10-CM

## 2011-12-16 MED ORDER — LEVALBUTEROL HCL 0.63 MG/3ML IN NEBU
0.6300 mg | INHALATION_SOLUTION | Freq: Once | RESPIRATORY_TRACT | Status: AC
  Administered 2011-12-16: 0.63 mg via RESPIRATORY_TRACT

## 2011-12-16 MED ORDER — ALBUTEROL SULFATE HFA 108 (90 BASE) MCG/ACT IN AERS: 2.0000 | INHALATION_SPRAY | RESPIRATORY_TRACT | Status: DC | PRN

## 2011-12-16 MED ORDER — PREDNISONE 5 MG PO TABS: ORAL_TABLET | ORAL | Status: DC

## 2011-12-16 MED ORDER — MOMETASONE FURO-FORMOTEROL FUM 100-5 MCG/ACT IN AERO
2.0000 | INHALATION_SPRAY | Freq: Two times a day (BID) | RESPIRATORY_TRACT | Status: DC
Start: 2011-12-16 — End: 2013-07-21

## 2011-12-16 MED ORDER — PREDNISONE 1 MG PO TABS: ORAL_TABLET | ORAL | Status: DC

## 2011-12-16 NOTE — Patient Instructions (Addendum)
Neb Xop 0.63  Finish prednisone taper  Scripts sent for home meds  Please call as needed

## 2011-12-16 NOTE — Progress Notes (Signed)
Patient ID: Kelli Fleming, female    DOB: [birth year], 56 y.o.   MRN: [id number]  HPI 11/14/10- 13 yoF never smoker with chronic rhinosinusitis/ surgery, asthmatic bronchitis.  Last here January 11, 20012. She has been off prednisone after slow taper, now off 5 days. No need for [facility] in 2 months. 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 She continues nasal antibiotics under [name] good care.  05/22/11-  50 yoF never smoker with chronic rhinosinusitis/ surgery, asthmatic bronchitis.  Had flu vaccine. Continues to work with [name]. Today she reports feeling very well, "the best in 3 years". She has been taking prednisone 5 mg daily since the summer. [name] suggested caution with any withdrawal from her recent exam was good. She continues daily Claritin and Dulera without need for rescue inhaler.  12/16/11- 89 yoF never smoker with chronic rhinosinusitis/ surgery, asthmatic bronchitis.  Dulera and albuterol inhalers, also, prednisone 5mg  and 1mg . Did well through the winter and got down to 4 mg daily maintenance prednisone. In the spring she had to increase to 5 mg daily. 2 weeks ago she began noting wheeze at night and for 5 days ago she began worse wheeze with cough. She started herself, as corrected, on a prednisone taper from 60 mg, now down to 40 mg daily. She started her inhaler. She has run out of [facility] but using her rescue inhaler twice a day. She is due for followup with [name] in 2 days. She has been using Afrin, 90 and his antifungal nasal treatment already started one week ago. She is blowing yellow from her nose but denies significant headache now.  Review of Systems See HPI Constitutional:   No-   weight loss, night sweats, fevers, chills, fatigue, lassitude. HEENT:   No-  headaches, difficulty swallowing, tooth/dental problems, sore throat,       No-  sneezing, itching, ear ache, +nasal congestion, post nasal drip,    CV:  No-   chest pain, orthopnea, PND, swelling in lower extremities, anasarca, dizziness, palpitations Resp: +  shortness of breath with exertion or at rest.              No-   productive cough,  No non-productive cough,  No- coughing up of blood.              No-   change in color of mucus.  + wheezing.   Skin: No-   rash or lesions. GI:  No-   heartburn, indigestion, abdominal pain, nausea, vomiting,  GU: . MS:  No-   joint pain or swelling.  . Neuro-     nothing unusual Psych:  No- change in mood or affect. No depression or anxiety.  No memory loss.   Objective:   Physical Exam General- Alert, Oriented, Affect-appropriate- always very pleasant, Distress- none acute;  Skin- rash-none, lesions- none, excoriation- none Lymphadenopathy- none Head- atraumatic            Eyes- Gross vision intact, PERRLA, conjunctivae clear secretions. Mild. Orbital edema.            Ears- Hearing, canals-normal            Nose- Clear, no-Septal dev, mucus, polyps, erosion, perforation             Throat- Mallampati II , mucosa- red , drainage- none, tonsils- atrophic Neck- flexible , trachea midline, no stridor , thyroid nl, carotid no bruit Chest - symmetrical excursion ,  unlabored           Heart/CV- RRR , no murmur , no gallop  , no rub, nl s1 s2                           - JVD- none , edema- none, stasis changes- none, varices- none           Lung- unlabored, + bilateral wheeze, + coarse cough , dullness-none, rub- none           Chest wall-  Abd-  Br/ Gen/ Rectal- Not done, not indicated Extrem- cyanosis- none, clubbing, none, atrophy- none, strength- nl Neuro- grossly intact to observation

## 2011-12-18 ENCOUNTER — Telehealth: Payer: Self-pay | Admitting: Internal Medicine

## 2011-12-18 MED ORDER — AMOXICILLIN-POT CLAVULANATE 875-125 MG PO TABS: 1.0000 | ORAL_TABLET | Freq: Two times a day (BID) | ORAL | Status: AC

## 2011-12-18 NOTE — Telephone Encounter (Signed)
Per CY-okay to give Augmentin 875 mg #14 take 1 po bid no refills.   Pt aware of rx sent.

## 2011-12-18 NOTE — Telephone Encounter (Signed)
Called, spoke with pt.  She was seen by Dr. Maple Hudson on Monday, June 10.  Reports all of her symptoms are unchanged since this visit except on yesterday evening she started coughing up green mucus with "flakes" of brown.  Denies f/c/s.  Requesting abx.  Dr. Maple Hudson, pls advise.  Thank you.  nkda - verified  [facility]

## 2011-12-21 NOTE — Assessment & Plan Note (Signed)
Exacerbation with uncertain relationship to the spring pollen season. Cannot exclude a viral triggered illness initially, or both. She restarted prednisone taper as directed. She will followup with [name]. Plan- we are refilling her rescue and maintenance inhalers. Today giving Xopenex nebulizer treatment. Complete prednisone taper back to 5 mg daily or lowest necessary maintenance dose.

## 2011-12-23 ENCOUNTER — Telehealth: Payer: Self-pay | Admitting: Internal Medicine

## 2011-12-23 MED ORDER — CLINDAMYCIN HCL 300 MG PO CAPS: 300.0000 mg | ORAL_CAPSULE | Freq: Two times a day (BID) | ORAL | Status: AC

## 2011-12-23 NOTE — Telephone Encounter (Signed)
Pt aware we have sent (again) the Cleocin Rx per CY.

## 2011-12-23 NOTE — Telephone Encounter (Signed)
Per CDY, Cleocin 300mg  #20 (BID x 10days) 0-r/f sent into [facility]. Pt aware.  Seward Meth, Salena Saner Medical Assistant 12/23/11

## 2011-12-23 NOTE — Telephone Encounter (Signed)
Spoke with pt. She states that she is feeling no better since the last appt 12-16-11. She states that her cough is still prod with green sputum and cough seems worse at hs. She states that she has had no new complaints. She is still taking 40 mg prednisone daily and has 2 doses left of augmentin. Would like to know if needs something stronger. Please advise, thanks! No Known Allergies

## 2012-01-12 ENCOUNTER — Other Ambulatory Visit: Payer: Self-pay | Admitting: Internal Medicine

## 2012-01-13 NOTE — Telephone Encounter (Signed)
Please advise if okay to refill as requested. Thanks.  

## 2012-01-13 NOTE — Telephone Encounter (Signed)
For refill, change this to prednisone 5 mg, # 100  1 or 2 daily as directed.

## 2012-03-02 ENCOUNTER — Other Ambulatory Visit: Payer: Self-pay | Admitting: Internal Medicine

## 2012-03-06 ENCOUNTER — Telehealth: Payer: Self-pay | Admitting: Internal Medicine

## 2012-03-06 MED ORDER — PREDNISONE 1 MG PO TABS: ORAL_TABLET | ORAL | Status: DC

## 2012-03-06 MED ORDER — PREDNISONE 5 MG PO TABS: ORAL_TABLET | ORAL | Status: DC

## 2012-03-06 NOTE — Telephone Encounter (Signed)
Spoke with pt to verify the msg. Pred was refilled for her and nothing further needed

## 2012-03-31 IMAGING — CT CT NECK W/ CM
4 of 6 series · 16 of 33 positions shown, 19 images · IV contrast (agent unspecified)
Comparison: None.

CLINICAL DATA: 53-year-old female with questionable laryngeal
obstruction.  Cough and  change in vocal quality.  No relief with
steroid therapy.

CT NECK WITH CONTRAST
TECHNIQUE: Multidetector CT imaging of the neck was performed with
intravenous contrast.
Contrast: 75 ml Nmnipaque-N55.

[Series 2: neck_routine 3.0 b40s st · axial · 0.39mm/px · z∈[-185,-65]mm · 3 of 82 slices shown]
[im 21/82  bone]
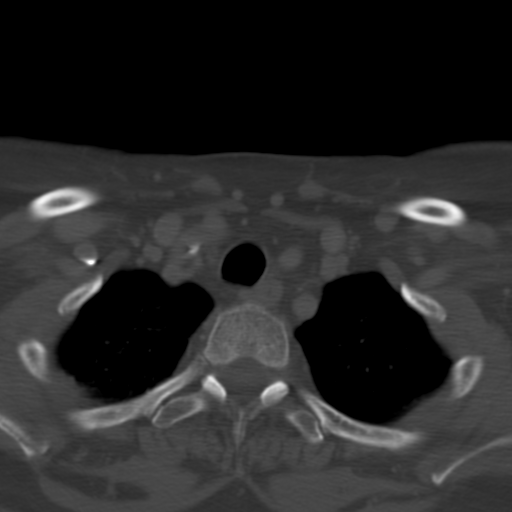
[im 41/82  bone]
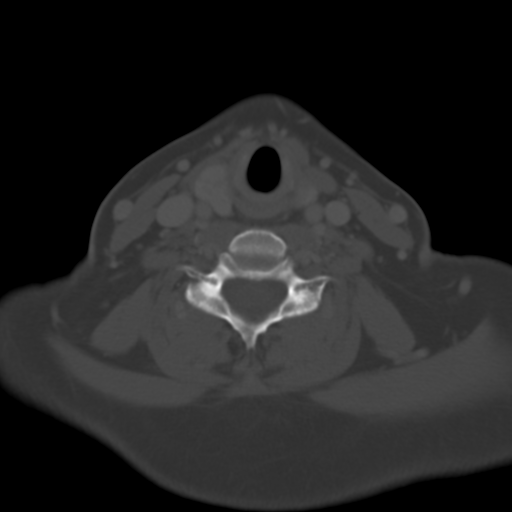
[im 61/82  bone]
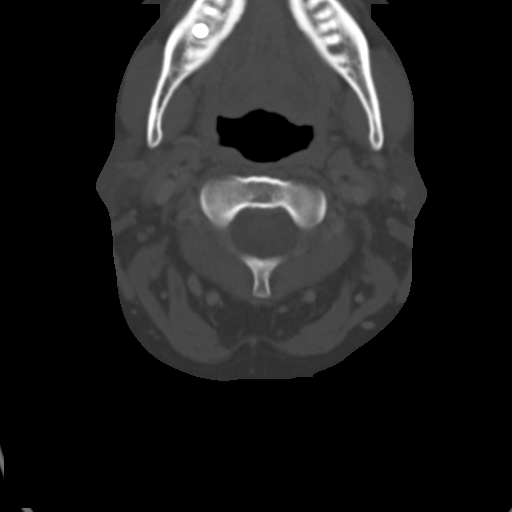

[Series 603: <mpr range(1)> · sagittal · 0.48mm/px · 5 of 99 slices shown, 6 images]
[im 33/99  bone]
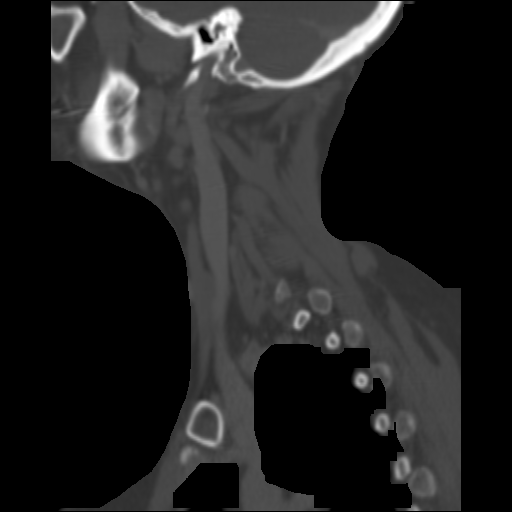
[im 41/99  bone]
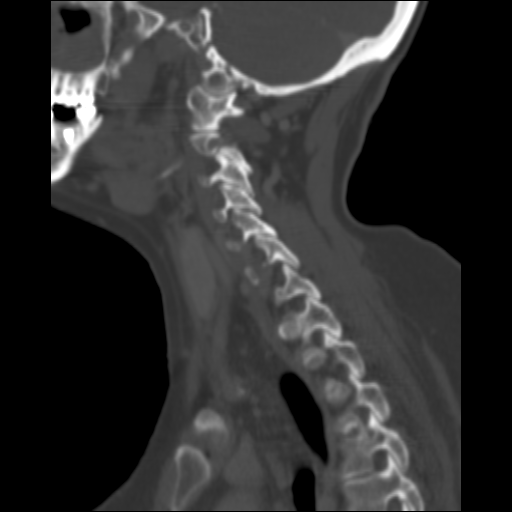
[im 50/99  soft-tissue]
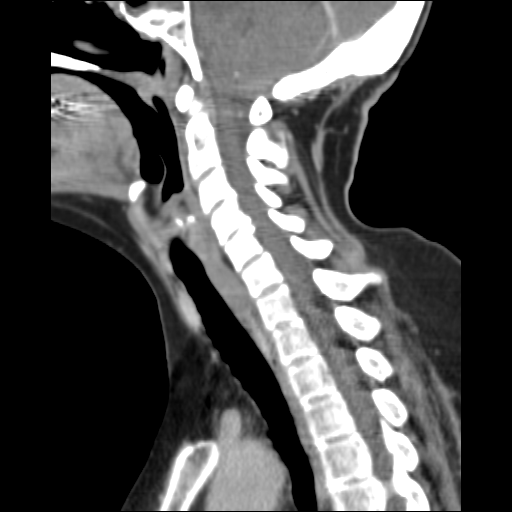
[im 50/99  bone]
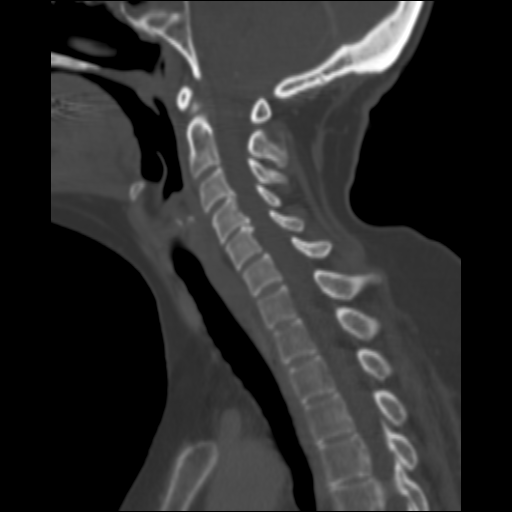
[im 58/99  bone]
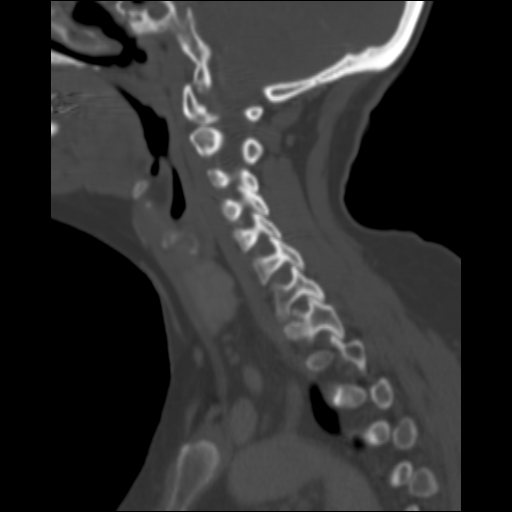
[im 66/99  bone]
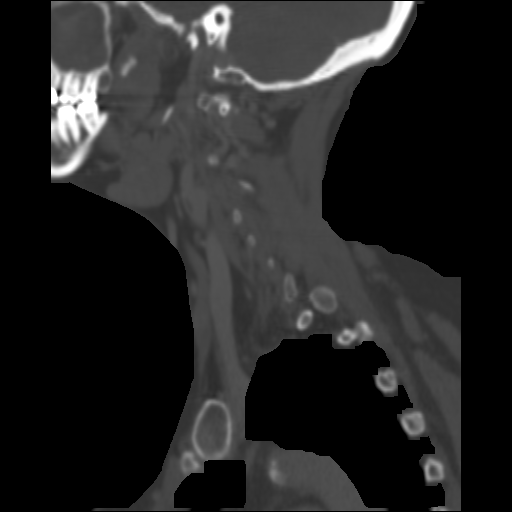

[Series 604: <mpr range(2)> · axial · 0.48mm/px · z∈[-244,-88]mm · 5 of 123 slices shown, 7 images]
[im 21/123  soft-tissue]
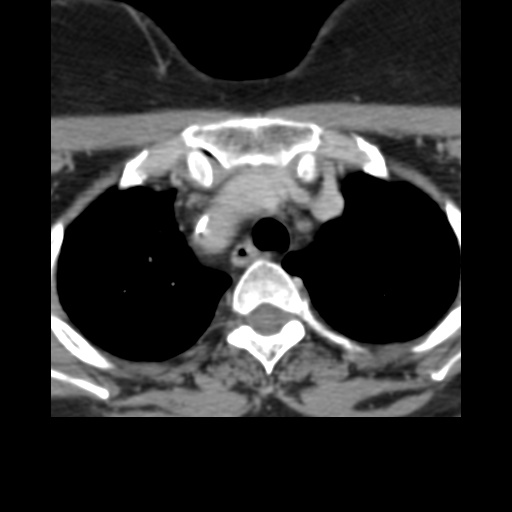
[im 21/123  bone]
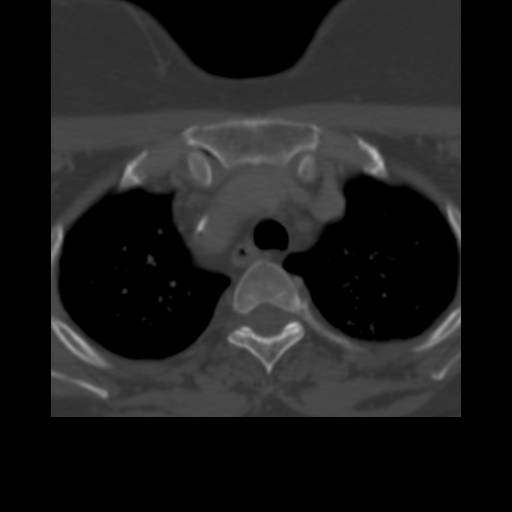
[im 41/123  bone]
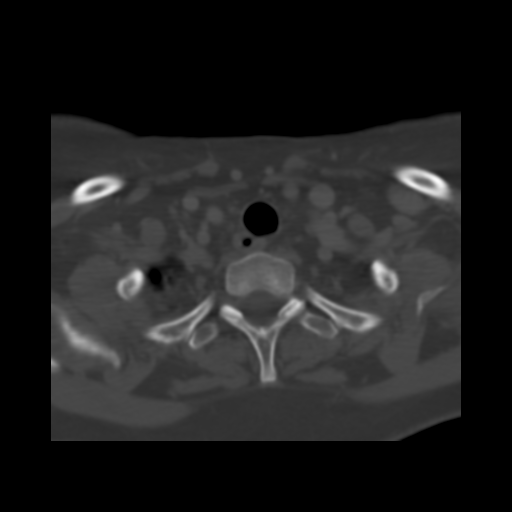
[im 62/123  bone]
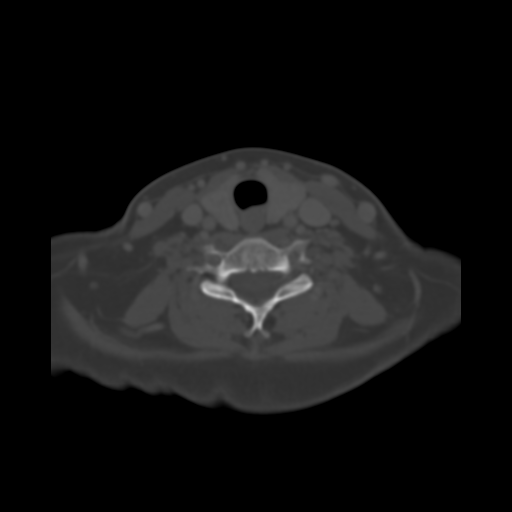
[im 82/123  bone]
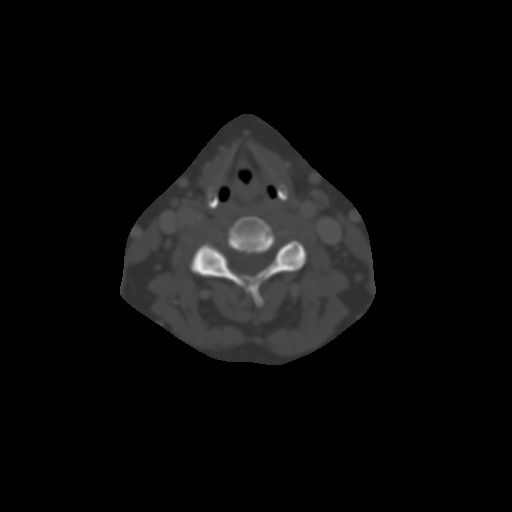
[im 102/123  soft-tissue]
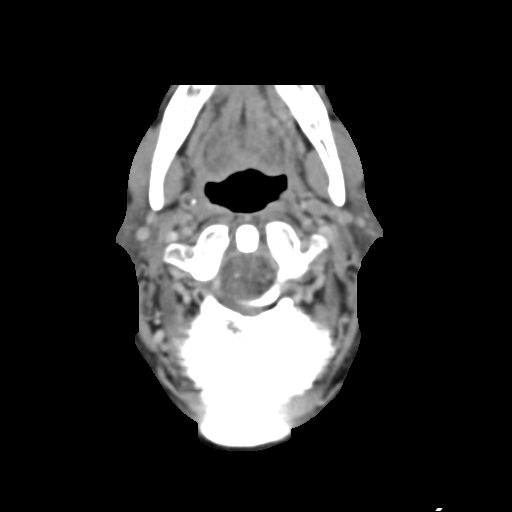
[im 102/123  bone]
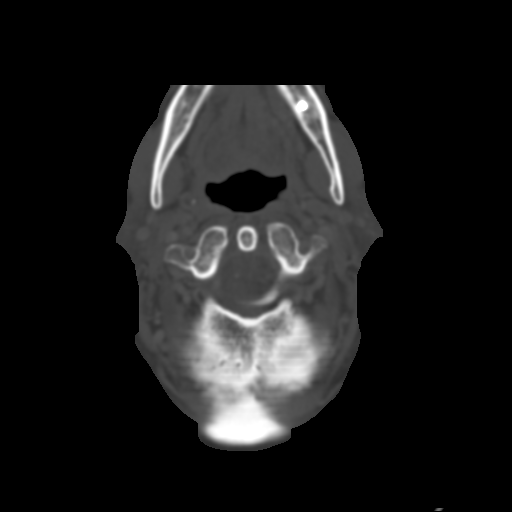

[Series 605: <mpr range(3)> · coronal · 0.48mm/px · 3 of 101 slices shown]
[im 14/101  bone]
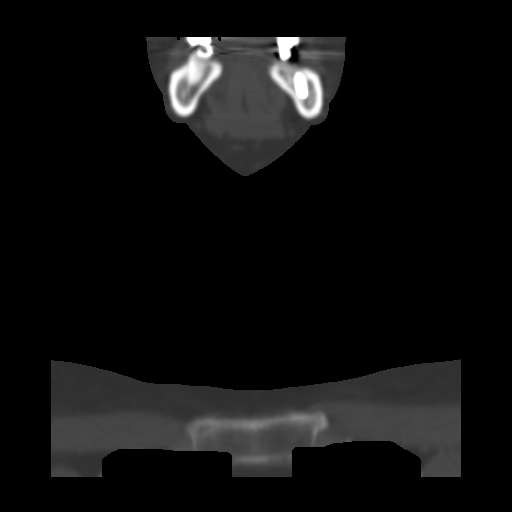
[im 51/101  bone]
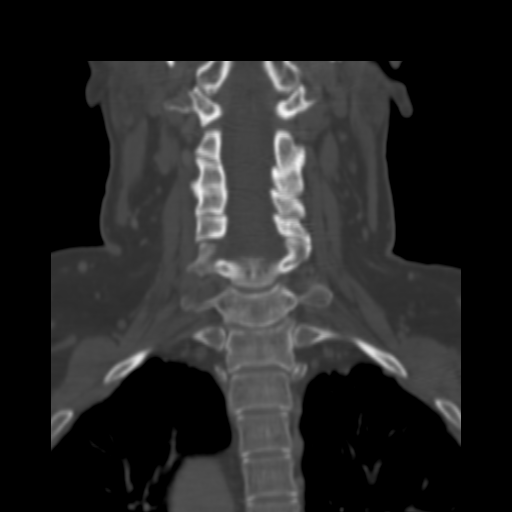
[im 88/101  bone]
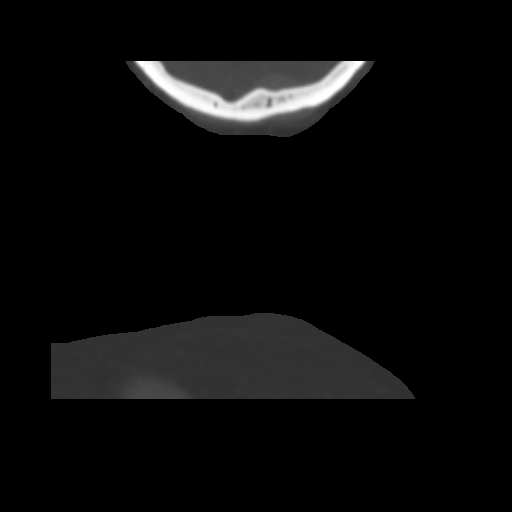

[16 of 33 positions shown; findings below may reference images not displayed]

FINDINGS: No lymphadenopathy.  Pharyngeal mucosal spaces and the
thyroid are within normal limits.  Parapharyngeal, retropharyngeal
and sublingual spaces are within normal limits.  Submandibular and
parotid glands are within normal limits. Visualized superior
mediastinum is within normal limits; multiple small nodes are
identified including in the aorticopulmonary window.

Visualized major vascular structures are patent.  There is mild
atherosclerosis at the left carotid bifurcation. Degenerative
changes in the cervical spine maximal at C4-C5. No acute osseous
abnormality identified.  Visualized brain parenchyma is within
normal limits (suspect artifactual streak in the right paracentral
pons on series 2 image 1).

Diffuse paranasal sinus mucosal thickening with mixed low and high
density material in the maxillary sinuses.  Sequelae of maxillary
antrostomies.  Tympanic cavities and mastoids are within normal
limits.

Mild dependent atelectasis in the visualized lungs.  No confluent
airspace disease.
IMPRESSION: 1.  No abnormality identified in the pharynx.  Still, if voice
changes persist recommend ENT consult for direct visualization of
the vocal cords.
2. No acute findings identified in the neck.
3.  Diffuse paranasal sinus inflammatory changes with evidence of
prior maxillary antrostomy, favor chronic sinusitis.

## 2012-04-22 ENCOUNTER — Other Ambulatory Visit: Payer: Self-pay | Admitting: Internal Medicine

## 2012-06-15 ENCOUNTER — Telehealth: Payer: Self-pay | Admitting: Internal Medicine

## 2012-06-15 MED ORDER — PREDNISONE 5 MG PO TABS: ORAL_TABLET | ORAL | Status: DC

## 2012-06-15 NOTE — Telephone Encounter (Signed)
Spoke with [facility] Msg verified rx sent and nothing further needed.

## 2012-06-17 ENCOUNTER — Ambulatory Visit: Payer: Commercial Managed Care - PPO | Admitting: Internal Medicine

## 2012-07-15 ENCOUNTER — Ambulatory Visit: Payer: Commercial Managed Care - PPO | Admitting: Internal Medicine

## 2012-07-15 ENCOUNTER — Ambulatory Visit (INDEPENDENT_AMBULATORY_CARE_PROVIDER_SITE_OTHER): Payer: Commercial Managed Care - PPO | Admitting: Internal Medicine

## 2012-07-15 ENCOUNTER — Encounter: Payer: Self-pay | Admitting: Internal Medicine

## 2012-07-15 VITALS — BP 130/78 | HR 126 | Ht 72.0 in | Wt 217.0 lb

## 2012-07-15 DIAGNOSIS — J328 Other chronic sinusitis: Secondary | ICD-10-CM

## 2012-07-15 DIAGNOSIS — J45909 Unspecified asthma, uncomplicated: Secondary | ICD-10-CM

## 2012-07-15 MED ORDER — PREDNISONE 10 MG PO TABS: 10.0000 mg | ORAL_TABLET | Freq: Every day | ORAL | Status: DC

## 2012-07-15 NOTE — Progress Notes (Signed)
Patient ID: Kelli Fleming, female    DOB: September 15, 1955, 57 y.o.   MRN: [id number]  HPI 11/14/10- 26 yoF never smoker with chronic rhinosinusitis/ surgery, asthmatic bronchitis.  Last here January 11, 20012. She has been off prednisone after slow taper, now off 5 days. No need for [facility] in 2 months. 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 She continues nasal antibiotics under [name] good care.  05/22/11-  41 yoF never smoker with chronic rhinosinusitis/ surgery, asthmatic bronchitis.  Had flu vaccine. Continues to work with [name]. Today she reports feeling very well, "the best in 3 years". She has been taking prednisone 5 mg daily since the summer. [name] suggested caution with any withdrawal from her recent exam was good. She continues daily Claritin and Dulera without need for rescue inhaler.  12/16/11- 36 yoF never smoker with chronic rhinosinusitis/ surgery, asthmatic bronchitis.  Dulera and albuterol inhalers, also, prednisone 5mg  and 1mg . Did well through the winter and got down to 4 mg daily maintenance prednisone. In the spring she had to increase to 5 mg daily. 2 weeks ago she began noting wheeze at night and for 5 days ago she began worse wheeze with cough. She started herself, as corrected, on a prednisone taper from 60 mg, now down to 40 mg daily. She started her inhaler. She has run out of [facility] but using her rescue inhaler twice a day. She is due for followup with [name] in 2 days. She has been using Afrin, 90 and his antifungal nasal treatment already started one week ago. She is blowing yellow from her nose but denies significant headache now.  07/15/12- 36 yoF never smoker with chronic rhinosinusitis/ surgery, asthmatic bronchitis.  FOLLOWS WUJ:[id number] well with breathing-staying on 10 mg qd of prednisone. In October, 2013, she tried tapering from 5 mg daily to 4 mg daily but developed chest tightness, shortness of  breath and weakness-So she went back to 10.  I explained the difference between disease relapse and adrenal insufficiency/steroid withdrawal. She reports bone density test "good". Not needing rescue inhaler at all. Continues to follow with [name].  Review of Systems-See HPI Constitutional:   No-   weight loss, night sweats, fevers, chills, fatigue, lassitude. HEENT:   No-  headaches, difficulty swallowing, tooth/dental problems, sore throat,       No-  sneezing, itching, ear ache, +nasal congestion, post nasal drip,  CV:  No-   chest pain, orthopnea, PND, swelling in lower extremities, anasarca, dizziness, palpitations Resp: +  shortness of breath with exertion or at rest.              No-   productive cough,  No non-productive cough,  No- coughing up of blood.              No-   change in color of mucus.  + wheezing.   Skin: No-   rash or lesions. GI:  No-   heartburn, indigestion, abdominal pain, nausea, vomiting,  GU: . MS:  No-   joint pain or swelling.  . Neuro-     nothing unusual Psych:  No- change in mood or affect. No depression or anxiety.  No memory loss.  Objective:   Physical Exam BP 130/78  Pulse 126  Ht 6' (1.829 m)  Wt 217 lb (98.431 kg)  BMI 29.43 kg/m2  SpO2 96% General- Alert, Oriented, Affect-appropriate- always very pleasant, Distress- none acute;  Skin- rash-none, lesions-  none, excoriation- none Lymphadenopathy- none Head- atraumatic            Eyes- Gross vision intact, PERRLA, conjunctivae clear secretions. Mild. Orbital edema.            Ears- Hearing, canals-normal            Nose- Clear, no-Septal dev, mucus, polyps, erosion, perforation             Throat- Mallampati II , mucosa- red , drainage- none, tonsils- atrophic Neck- flexible , trachea midline, no stridor , thyroid nl, carotid no bruit Chest - symmetrical excursion , unlabored           Heart/CV- RRR , no murmur , no gallop  , no rub, nl s1 s2                           - JVD- none ,  edema- none, stasis changes- none, varices- none           Lung- unlabored, clear , dullness-none, rub- none           Chest wall-  Abd-  Br/ Gen/ Rectal- Not done, not indicated Extrem- cyanosis- none, clubbing, none, atrophy- none, strength- nl Neuro- grossly intact to observation

## 2012-07-15 NOTE — Patient Instructions (Addendum)
Script sent for 90 day refillable prednisone 10 mg  Script sent locally for prednisone 10 mg  You could try taking prednisone 10 mg daily x 2 days, then 5 mg x 1 day, then 10 mg x 2 days... And repeating that cycle every 3 days. See how you tolerate that.   Please call as  needed

## 2012-07-26 NOTE — Assessment & Plan Note (Signed)
I'm not sure if reducing systemic steroids will expose chronic rhinitis symptoms but I discussed the possibility.

## 2012-07-26 NOTE — Assessment & Plan Note (Signed)
She sounds very clear now. We talked about ways to slowly reduce her steroid therapy, watching for exacerbation and allowing for her adrenal adjustment.

## 2012-08-03 ENCOUNTER — Telehealth: Payer: Self-pay | Admitting: Internal Medicine

## 2012-08-03 MED ORDER — PREDNISONE 5 MG PO TABS: 5.0000 mg | ORAL_TABLET | ORAL | Status: DC

## 2012-08-03 NOTE — Telephone Encounter (Signed)
Rx was sent to pharm

## 2012-09-04 ENCOUNTER — Encounter: Payer: Self-pay | Admitting: Internal Medicine

## 2012-10-08 ENCOUNTER — Telehealth: Payer: Self-pay | Admitting: Internal Medicine

## 2012-10-08 DIAGNOSIS — G4733 Obstructive sleep apnea (adult) (pediatric): Secondary | ICD-10-CM

## 2012-10-08 NOTE — Telephone Encounter (Signed)
Per CY-okay to order NPSG split night study with DX OSA.   Pt is aware that order has been placed and our PCC's will contact her with appt date and time.

## 2012-10-08 NOTE — Telephone Encounter (Signed)
I spoke with pt. She stated she would like to get scheduled for a sleep study. She stated she snores and her husband advised her he notices at night she gasps for air. Pt has not been seen for sleep apnea. Please advise Dr. Maple Hudson thanks Last OV 07/15/12  pending 01/13/13

## 2012-10-11 ENCOUNTER — Ambulatory Visit (HOSPITAL_BASED_OUTPATIENT_CLINIC_OR_DEPARTMENT_OTHER): Payer: Commercial Managed Care - PPO | Attending: Internal Medicine | Admitting: Radiology

## 2012-10-11 VITALS — Ht 72.0 in | Wt 220.0 lb

## 2012-10-11 DIAGNOSIS — G4733 Obstructive sleep apnea (adult) (pediatric): Secondary | ICD-10-CM

## 2012-10-11 DIAGNOSIS — R0989 Other specified symptoms and signs involving the circulatory and respiratory systems: Secondary | ICD-10-CM | POA: Insufficient documentation

## 2012-10-11 DIAGNOSIS — G4710 Hypersomnia, unspecified: Secondary | ICD-10-CM | POA: Insufficient documentation

## 2012-10-11 DIAGNOSIS — R0609 Other forms of dyspnea: Secondary | ICD-10-CM | POA: Insufficient documentation

## 2012-10-17 DIAGNOSIS — R0989 Other specified symptoms and signs involving the circulatory and respiratory systems: Secondary | ICD-10-CM

## 2012-10-17 DIAGNOSIS — G4733 Obstructive sleep apnea (adult) (pediatric): Secondary | ICD-10-CM

## 2012-10-17 DIAGNOSIS — R0609 Other forms of dyspnea: Secondary | ICD-10-CM

## 2012-10-17 NOTE — Procedures (Signed)
NAME:  Kelli Fleming, Kelli Fleming               ACCOUNT NO.:  [id number]  MEDICAL RECORD NO.:  [id number]          PATIENT TYPE:  OUT  LOCATION:  SLEEP CENTER                 FACILITY:  [facility]  PHYSICIAN:  Clinton D. Maple Hudson, MD, FCCP, FACPDATE OF BIRTH:  1955/11/04  DATE OF STUDY:  10/11/2012                           NOCTURNAL POLYSOMNOGRAM  REFERRING PHYSICIAN:  Clinton D. Young, MD, FCCP, FACP  INDICATION FOR STUDY:  Hypersomnia with sleep apnea.  EPWORTH SLEEPINESS SCORE:  11/24.  BMI 29.8, weight 220 pounds, height 72 inches.  Neck 15 inches.  MEDICATIONS:  Home medications are charted and reviewed.  SLEEP ARCHITECTURE:  Total sleep time 125.5 minutes with sleep efficiency 32.3%.  Stage I was 19.9%, stage II 79.7%.  Stage III and REM were absent.  Sleep latency 84 minutes.  REM latency 249.5 minutes, awake after sleep onset 179 minutes.  Arousal index 15.3.  Bedtime medication:  None.  She had significant difficulty initiating and maintaining sleep with a brief interval of sleep around midnight and then mostly awake again and until shortly after 3:00 a.m.  RESPIRATORY DATA:  Apnea-hypopnea index (AHI) 0 per hour.  No respiratory events met scoring criteria.  OXYGEN DATA:  Mild snoring with oxygen desaturation to a nadir of 86% on room air.  At 2:13 a.m., the technician placed nasal prong oxygen at 1 L because of desaturation into the 80% range.  This looks as if it might have been artifactual.  The mean oxygen saturation through the study was 93.2% on 1 L prongs.  CARDIAC DATA:  Normal sinus rhythm.  MOVEMENT/PARASOMNIA:  Occasional incidental limb jerks with little impact on sleep.  Bathroom x1.  IMPRESSION/RECOMMENDATION: 1. Difficulty initiating and maintaining sleep with sustained sleep     not achieved until after 3 a.m. without medication. 2. Supplemental oxygen at 1 L/minute, was placed by the technician at     2:13 a.m. because of desaturation into the 80% range.  This  may     have been artifact and can be verified with overnight oximetry if     appropriate.  Mild snoring with recorded desaturation nadir of 86%,     and mean oxygen saturation through the study of 93.2% on 1 L     supplementation.  On arrival on room air while awake, saturation     was 95%. 3. No significant respiratory sleep disturbance, within normal limits.     AHI 0 per hour (the normal range for adults is from 0-5 events per     hour).     Clinton D. Maple Hudson, MD, [facility], FACP Diplomate, American Board of Sleep Medicine    CDY/MEDQ  D:  10/17/2012 10:20:16  T:  10/17/2012 22:49:53  Job:  [id number]

## 2013-01-13 ENCOUNTER — Ambulatory Visit: Payer: Commercial Managed Care - PPO | Admitting: Internal Medicine

## 2013-01-13 ENCOUNTER — Encounter: Payer: Self-pay | Admitting: Internal Medicine

## 2013-01-13 ENCOUNTER — Ambulatory Visit (INDEPENDENT_AMBULATORY_CARE_PROVIDER_SITE_OTHER): Payer: Commercial Managed Care - PPO | Admitting: Internal Medicine

## 2013-01-13 VITALS — BP 120/84 | HR 87 | Ht 72.0 in | Wt 201.6 lb

## 2013-01-13 DIAGNOSIS — J328 Other chronic sinusitis: Secondary | ICD-10-CM

## 2013-01-13 DIAGNOSIS — J4540 Moderate persistent asthma, uncomplicated: Secondary | ICD-10-CM

## 2013-01-13 DIAGNOSIS — J45909 Unspecified asthma, uncomplicated: Secondary | ICD-10-CM

## 2013-01-13 MED ORDER — PREDNISONE 10 MG PO TABS: ORAL_TABLET | ORAL | Status: DC

## 2013-01-13 MED ORDER — PREDNISONE 1 MG PO TABS: ORAL_TABLET | ORAL | Status: DC

## 2013-01-13 NOTE — Patient Instructions (Addendum)
If you find you can, we would like to use combinations of your 10 mg and 1 mg prednisone tabs to try to reduce the daily dose to 18 or 19 mg daily.   Scripts were sent  Please call as needed

## 2013-01-13 NOTE — Progress Notes (Signed)
Patient ID: Kelli Fleming, female    DOB: 07-01-1956, 57 y.o.   MRN: [id number]  HPI 11/14/10- 18 yoF never smoker with chronic rhinosinusitis/ surgery, asthmatic bronchitis.  Last here January 11, 20012. She has been off prednisone after slow taper, now off 5 days. No need for [facility] in 2 months. 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 She continues nasal antibiotics under [name] good care.  05/22/11-  75 yoF never smoker with chronic rhinosinusitis/ surgery, asthmatic bronchitis.  Had flu vaccine. Continues to work with [name]. Today she reports feeling very well, "the best in 3 years". She has been taking prednisone 5 mg daily since the summer. [name] suggested caution with any withdrawal from her recent exam was good. She continues daily Claritin and Dulera without need for rescue inhaler.  12/16/11- 8 yoF never smoker with chronic rhinosinusitis/ surgery, asthmatic bronchitis.  Dulera and albuterol inhalers, also, prednisone 5mg  and 1mg . Did well through the winter and got down to 4 mg daily maintenance prednisone. In the spring she had to increase to 5 mg daily. 2 weeks ago she began noting wheeze at night and for 5 days ago she began worse wheeze with cough. She started herself, as corrected, on a prednisone taper from 60 mg, now down to 40 mg daily. She started her inhaler. She has run out of [facility] but using her rescue inhaler twice a day. She is due for followup with [name] in 2 days. She has been using Afrin, 90 and his antifungal nasal treatment already started one week ago. She is blowing yellow from her nose but denies significant headache now.  07/15/12- 70 yoF never smoker with chronic rhinosinusitis/ surgery, asthmatic bronchitis.  FOLLOWS WUJ:WJXBJ well with breathing-staying on 10 mg qd of prednisone. In October, 2013, she tried tapering from 5 mg daily to 4 mg daily but developed chest tightness, shortness of  breath and weakness-So she went back to 10.  I explained the difference between disease relapse and adrenal insufficiency/steroid withdrawal. She reports bone density test "good". Not needing rescue inhaler at all. Continues to follow with [name].  01/13/13- 56 yoF never smoker with chronic rhinosinusitis/ surgery, asthmatic bronchitis.  FOLLOWS FOR: pt reports breathing is doing GREAT!! Pt is back to 20mg  daily pred since last visit-- denies any other concerns at this time. We discussed steroid therapy again and the distinction between relapse of symptoms versus symptoms of adrenal insufficiency  Review of Systems-See HPI Constitutional:   No-   weight loss, night sweats, fevers, chills, fatigue, lassitude. HEENT:   No-  headaches, difficulty swallowing, tooth/dental problems, sore throat,       No-  sneezing, itching, ear ache, no-nasal congestion, post nasal drip,  CV:  No-   chest pain, orthopnea, PND, swelling in lower extremities, anasarca, dizziness, palpitations Resp: no- shortness of breath with exertion or at rest.              No-   productive cough,  No non-productive cough,  No- coughing up of blood.              No-   change in color of mucus.  + wheezing.   Skin: No-   rash or lesions. GI:  No-   heartburn, indigestion, abdominal pain, nausea, vomiting,  GU: . MS:  No-   joint pain or swelling.  . Neuro-     nothing unusual Psych:  No- change in  mood or affect. No depression or anxiety.  No memory loss.  Objective:   Physical Exam  General- Alert, Oriented, Affect-appropriate- always very pleasant, Distress- none acute;  Skin- rash-none, lesions- none, excoriation- none Lymphadenopathy- none Head- atraumatic            Eyes- Gross vision intact, PERRLA, conjunctivae clear secretions. Mild. Orbital edema.            Ears- Hearing, canals-normal            Nose- Clear, no-Septal dev, mucus, polyps, erosion, perforation             Throat- Mallampati II , mucosa-  red , drainage- none, tonsils- atrophic Neck- flexible , trachea midline, no stridor , thyroid nl, carotid no bruit Chest - symmetrical excursion , unlabored           Heart/CV- RRR , no murmur , no gallop  , no rub, nl s1 s2                           - JVD- none , edema- none, stasis changes- none, varices- none           Lung- unlabored, clear , dullness-none, rub- none           Chest wall-  Abd-  Br/ Gen/ Rectal- Not done, not indicated Extrem- cyanosis- none, clubbing, none, atrophy- none, strength- nl Neuro- grossly intact to observation

## 2013-01-27 NOTE — Assessment & Plan Note (Signed)
Sinus disease appears to be well-controlled, but on prednisone. We have compared prednisone to nasal steroids

## 2013-01-27 NOTE — Assessment & Plan Note (Addendum)
Abrupt adult onset with chronic sinus disease, steroid dependent. Plan-we discussed ways to very slowly reduce prednisone. She is going to try to reach 18-19 mg daily, going to 20 only when clearly needed. Giving 1 mg tabs.

## 2013-02-24 ENCOUNTER — Telehealth: Payer: Self-pay | Admitting: Internal Medicine

## 2013-02-24 NOTE — Telephone Encounter (Signed)
Called and did not receive an answer. LMOAM for pt to return my call to [phone]. Rhonda J Cobb

## 2013-02-25 NOTE — Telephone Encounter (Signed)
Attempted to call patient, no answer. LMOAM for pt to return my call at [phone]. Advised patient that I would be leaving the office today at 12:30, if this matter could not wait until I return on Friday 02/26/13, to contact my partner, Almyra Free at [phone]. Rhonda J Cobb

## 2013-03-01 NOTE — Telephone Encounter (Signed)
Attempted to contact patient at phone number listed below. No answer. LMOAM for pt to return my call to [phone]. Rhonda J Cobb

## 2013-03-03 NOTE — Telephone Encounter (Signed)
Spoke with pt and she wanted to inform Bjorn Loser that she has spoken with [facility] and everything is taken care of.

## 2013-04-16 ENCOUNTER — Other Ambulatory Visit: Payer: Self-pay | Admitting: Internal Medicine

## 2013-07-21 ENCOUNTER — Ambulatory Visit (INDEPENDENT_AMBULATORY_CARE_PROVIDER_SITE_OTHER): Payer: PRIVATE HEALTH INSURANCE | Admitting: Internal Medicine

## 2013-07-21 ENCOUNTER — Encounter: Payer: Self-pay | Admitting: Internal Medicine

## 2013-07-21 ENCOUNTER — Telehealth: Payer: Self-pay | Admitting: Internal Medicine

## 2013-07-21 VITALS — BP 118/80 | HR 107 | Ht 72.0 in | Wt 225.6 lb

## 2013-07-21 DIAGNOSIS — J328 Other chronic sinusitis: Secondary | ICD-10-CM

## 2013-07-21 DIAGNOSIS — J45909 Unspecified asthma, uncomplicated: Secondary | ICD-10-CM

## 2013-07-21 MED ORDER — ALBUTEROL SULFATE HFA 108 (90 BASE) MCG/ACT IN AERS: INHALATION_SPRAY | RESPIRATORY_TRACT | Status: DC

## 2013-07-21 MED ORDER — MOMETASONE FURO-FORMOTEROL FUM 100-5 MCG/ACT IN AERO: 2.0000 | INHALATION_SPRAY | Freq: Two times a day (BID) | RESPIRATORY_TRACT | Status: DC

## 2013-07-21 MED ORDER — PREDNISONE 10 MG PO TABS: ORAL_TABLET | ORAL | Status: DC

## 2013-07-21 MED ORDER — PREDNISONE 1 MG PO TABS: ORAL_TABLET | ORAL | Status: DC

## 2013-07-21 NOTE — Assessment & Plan Note (Signed)
Again discussed her fear of recurrent severe sinusitis vs ordinary winter viral episodes, vs steroid withdrawal/ adrenal insufficiency. Plan- defer imaging unless she has symptoms. She will f/u with Dr Annalee GentaShoemaker. Try tapering prednisone by 1 mg/ week to whatever the lowest dose she can sustain. F/u with Dr Jacky KindleAronson for monitoring of bone health.

## 2013-07-21 NOTE — Telephone Encounter (Signed)
Pt called back and she stated that [facility] the [facility] here today at her appt.  Her mail order company is   [employer] [place], [place] Fax #  [phone] Phone #  [phone]  This has been added to her pharmacy list.  [facility] to make [facility] aware.

## 2013-07-21 NOTE — Telephone Encounter (Signed)
[id number] x1 

## 2013-07-21 NOTE — Progress Notes (Signed)
Patient ID: Kelli Fleming, female    DOB: 1955-08-09, 58 y.o.   MRN: [id number]  HPI 11/14/10- 29 yoF never smoker with chronic rhinosinusitis/ surgery, asthmatic bronchitis.  Last here January 11, 20012. She has been off prednisone after slow taper, now off 5 days. No need for [facility] in 2 months. 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 She continues nasal antibiotics under [name] good care.  05/22/11-  67 yoF never smoker with chronic rhinosinusitis/ surgery, asthmatic bronchitis.  Had flu vaccine. Continues to work with [name]. Today she reports feeling very well, "the best in 3 years". She has been taking prednisone 5 mg daily since the summer. [name] suggested caution with any withdrawal from her recent exam was good. She continues daily Claritin and Dulera without need for rescue inhaler.  12/16/11- 28 yoF never smoker with chronic rhinosinusitis/ surgery, asthmatic bronchitis.  Dulera and albuterol inhalers, also, prednisone 5mg  and 1mg . Did well through the winter and got down to 4 mg daily maintenance prednisone. In the spring she had to increase to 5 mg daily. 2 weeks ago she began noting wheeze at night and for 5 days ago she began worse wheeze with cough. She started herself, as corrected, on a prednisone taper from 60 mg, now down to 40 mg daily. She started her inhaler. She has run out of [facility] but using her rescue inhaler twice a day. She is due for followup with [name] in 2 days. She has been using Afrin, 90 and his antifungal nasal treatment already started one week ago. She is blowing yellow from her nose but denies significant headache now.  07/15/12- 66 yoF never smoker with chronic rhinosinusitis/ surgery, asthmatic bronchitis.  FOLLOWS WUJ:WJXBJ well with breathing-staying on 10 mg qd of prednisone. In October, 2013, she tried tapering from 5 mg daily to 4 mg daily but developed chest tightness, shortness of  breath and weakness-So she went back to 10.  I explained the difference between disease relapse and adrenal insufficiency/steroid withdrawal. She reports bone density test "good". Not needing rescue inhaler at all. Continues to follow with [name].  01/13/13- 56 yoF never smoker with chronic rhinosinusitis/ surgery, asthmatic bronchitis.  FOLLOWS FOR: pt reports breathing is doing GREAT!! Pt is back to 20mg  daily pred since last visit-- denies any other concerns at this time. We discussed steroid therapy again and the distinction between relapse of symptoms versus symptoms of adrenal insufficiency  07/21/13- 57 yoF never smoker with atypical chronic rhinosinusitis/ surgery, asthmatic bronchitis FOLLOWS YNW:[id number] on Prednisone 20 mg daily NPSG 10/11/12- AHI 0/ hr, mild snoring. Difficulty initiating sleep.  She never got below 17 mg daily prednisone since LOV. Would feel profoundly tired and head congestion. Dr [name] does annual bone density. Talked again about steroid side effects. She feels very well now, denying head or chest congestion, discharge, headache or cough. Able to work full time. Pending f/u w/ Dr [name] and knows she has polyps. Does use Dulera, occ Proair.  Review of Systems-See HPI Constitutional:   No-   weight loss, night sweats, fevers, chills, fatigue, lassitude. HEENT:   No-  headaches, difficulty swallowing, tooth/dental problems, sore throat,       No-  sneezing, itching, ear ache, no-nasal congestion, post nasal drip,  CV:  No-   chest pain, orthopnea, PND, swelling in lower extremities, anasarca, dizziness, palpitations Resp: no- shortness of breath with exertion or at rest.  No-   productive cough,  No non-productive cough,  No- coughing up of blood.              No-   change in color of mucus.  No- wheezing.   Skin: No-   rash or lesions. GI:  No-   heartburn, indigestion, abdominal pain, nausea, vomiting,  GU: . MS:  No-   joint pain or  swelling.  . Neuro-     nothing unusual Psych:  No- change in mood or affect. No depression or anxiety.  No memory loss.  Objective:   Physical Exam  General- Alert, Oriented, Affect-appropriate- always very pleasant, Distress- none acute;  Skin- rash-none, lesions- none, excoriation- none Lymphadenopathy- none Head- atraumatic            Eyes- Gross vision intact, PERRLA, conjunctivae clear secretions. Mild. Orbital edema.            Ears- Hearing, canals-normal            Nose- Clear anteriorly with polyps visible -nonocclusive. , no-Septal dev, mucus, erosion, perforation             Throat- Mallampati II , mucosa- red , drainage- none, tonsils- atrophic Neck- flexible , trachea midline, no stridor , thyroid nl, carotid no bruit Chest - symmetrical excursion , unlabored           Heart/CV- RRR , no murmur , no gallop  , no rub, nl s1 s2                           - JVD- none , edema- none, stasis changes- none, varices- none           Lung- unlabored, clear , dullness-none, rub- none           Chest wall-  Abd-  Br/ Gen/ Rectal- Not done, not indicated Extrem- cyanosis- none, clubbing, none, atrophy- none, strength- nl Neuro- grossly intact to observation

## 2013-07-21 NOTE — Patient Instructions (Signed)
Keep appointment with Dr Annalee GentaShoemaker. I think I am seeing small nasal polyps he can better evaluate.  Keep trying to reduce prednisone as able- 1 mg/ week. Go back up when you have to.   We can refill current meds

## 2013-07-21 NOTE — Assessment & Plan Note (Signed)
Controlled on steroids   This developed after her sinus disease, but is presumably the same process.

## 2013-07-21 NOTE — Telephone Encounter (Signed)
Patient's daughter calling back about the same as below.

## 2013-07-21 NOTE — Telephone Encounter (Signed)
Rx's sent for patient-2 prednisone rx's, Elwin SleightDulera, and Proair HFA as discussed at today's visit. I left a message for patient that I have sent Rx's and if any questions or concerns to please call the office.

## 2013-09-09 ENCOUNTER — Ambulatory Visit (INDEPENDENT_AMBULATORY_CARE_PROVIDER_SITE_OTHER): Payer: PRIVATE HEALTH INSURANCE

## 2013-09-09 ENCOUNTER — Ambulatory Visit (INDEPENDENT_AMBULATORY_CARE_PROVIDER_SITE_OTHER): Payer: PRIVATE HEALTH INSURANCE | Admitting: Podiatry

## 2013-09-09 ENCOUNTER — Encounter: Payer: Self-pay | Admitting: Podiatry

## 2013-09-09 VITALS — BP 144/79 | HR 90 | Resp 12

## 2013-09-09 DIAGNOSIS — R52 Pain, unspecified: Secondary | ICD-10-CM

## 2013-09-09 DIAGNOSIS — M779 Enthesopathy, unspecified: Secondary | ICD-10-CM

## 2013-09-09 DIAGNOSIS — S92309A Fracture of unspecified metatarsal bone(s), unspecified foot, initial encounter for closed fracture: Secondary | ICD-10-CM

## 2013-09-09 NOTE — Progress Notes (Signed)
   Subjective:    Patient ID: Kelli PomfretKaren G Fleming, female    DOB: 01/04/1956, 58 y.o.   MRN: [id number]  HPI BOTH FEET ARE PAINFUL FOR 2 WEEKS THE FEET GET AGGRAVATED BY WALKING AND PUTTING PRESSURE. TRIED TO WEAR BOOT BUT NOT HELPING.    Review of Systems  All other systems reviewed and are negative.       Objective:   Physical Exam        Assessment & Plan:

## 2013-09-10 NOTE — Progress Notes (Signed)
Subjective:     Patient ID: Kelli PomfretKaren G Fleming, female   DOB: 06/20/1956, 58 y.o.   MRN: [id number]  HPI patient presents stating that the outside of my left foot has been killing me for the last week or 2. Does not remember a specific injury but it seemed to start hurting after a pedicure. Also complains of discomfort in the front part of the right foot recently   Review of Systems     Objective:   Physical Exam Neurovascular status unchanged with no change in the patient's health history. Patient is found to have inflammation and edema around the fifth metatarsal base left that is very tender when pressed and moderate discomfort around the second metatarsophalangeal joint of the right foot    Assessment:     Possibility for tendinitis versus fracture of metatarsal base left and what appears to be compensated capsulitis right second MPJ    Plan:     H&P and x-rays reviewed with patient. Applied air fracture walker left and instructed on fracture about the possibility for fixation if it does not get better. Patient will wear a boot and be seen back in 3 weeks

## 2013-09-13 ENCOUNTER — Telehealth: Payer: Self-pay | Admitting: Internal Medicine

## 2013-09-13 DIAGNOSIS — J42 Unspecified chronic bronchitis: Secondary | ICD-10-CM

## 2013-09-13 NOTE — Telephone Encounter (Signed)
Ok to order CXR dx chronic bronchitis

## 2013-09-13 NOTE — Telephone Encounter (Signed)
Called and spoke wit pt. She is requesting a CXR.  She reports since last OV she broke her left foot. She is seeing an ortho doc.  She reports she had a fungal "lung infection" in the past d/t her being on prednisone frequently. The ortho doc advised her prednisone can deteriate the bones and reason she could have broke her foot. She just want a follow up cxr of her lungs. Please advise Dr. Maple HudsonYoung thanks

## 2013-09-13 NOTE — Telephone Encounter (Signed)
Pt aware Order placed Nothing further needed. 

## 2013-09-14 ENCOUNTER — Ambulatory Visit (INDEPENDENT_AMBULATORY_CARE_PROVIDER_SITE_OTHER)
Admission: RE | Admit: 2013-09-14 | Discharge: 2013-09-14 | Disposition: A | Discharge status: Home / Self Care | Payer: PRIVATE HEALTH INSURANCE | Source: Ambulatory Visit | Attending: Internal Medicine | Admitting: Internal Medicine

## 2013-09-14 DIAGNOSIS — J42 Unspecified chronic bronchitis: Secondary | ICD-10-CM

## 2013-09-17 ENCOUNTER — Telehealth: Payer: Self-pay | Admitting: Internal Medicine

## 2013-09-17 NOTE — Telephone Encounter (Signed)
SHE THINKS THAT'S WHAT THE CALL WAS A/B.Kelli Fleming

## 2013-09-17 NOTE — Telephone Encounter (Signed)
Result Note     CXR- some changes of chronic bronchitis, including a little scarring in the lower lung zones. No acute process or major changes seen   I spoke with patient about results and she verbalized understanding and had no questions

## 2013-09-27 ENCOUNTER — Ambulatory Visit (INDEPENDENT_AMBULATORY_CARE_PROVIDER_SITE_OTHER): Payer: PRIVATE HEALTH INSURANCE | Admitting: Podiatry

## 2013-09-27 ENCOUNTER — Encounter: Payer: Self-pay | Admitting: Podiatry

## 2013-09-27 ENCOUNTER — Ambulatory Visit (INDEPENDENT_AMBULATORY_CARE_PROVIDER_SITE_OTHER): Payer: PRIVATE HEALTH INSURANCE

## 2013-09-27 VITALS — BP 132/77 | HR 117 | Resp 16

## 2013-09-27 DIAGNOSIS — M79609 Pain in unspecified limb: Secondary | ICD-10-CM

## 2013-09-27 DIAGNOSIS — M8448XA Pathological fracture, other site, initial encounter for fracture: Secondary | ICD-10-CM

## 2013-09-27 NOTE — Progress Notes (Signed)
   Subjective:    Patient ID: Kelli PomfretKaren G Fleming, female    DOB: 02/13/1956, 58 y.o.   MRN: [id number] Pt presents for follow up of fracture left lateral foot.  Pt states doing well. HPI    Review of Systems     Objective:   Physical Exam        Assessment & Plan:

## 2013-09-28 NOTE — Progress Notes (Signed)
Subjective:     Patient ID: Kelli PomfretKaren G Fleming, female   DOB: 12/08/1955, 58 y.o.   MRN: [id number]  HPI patient states that the left foot is feeling quite a bit better and when she wears her boot she does not have any pain and she is able to weight-bear on it and work without any issues. States she has mild discomfort if she steps on it without the boot and has no achy pain at night. Daughter is getting married this weekend   Review of Systems     Objective:   Physical Exam Neurovascular status intact with mild discomfort when I pressed base of fifth metatarsal left foot    Assessment:     Avulsion fracture base of fifth metatarsal left which clinically appears to be healing well with mild discomfort and no significant edema noted    Plan:     X-ray reviewed and we will continue with immobilization with the understanding that it's still possible it will need to be fixated at one point. Patient does not want to have surgery if she does not have to and are hopeful it will heal even though it'll need to be watched closely. Reappoint 4 weeks

## 2013-10-25 ENCOUNTER — Ambulatory Visit: Payer: PRIVATE HEALTH INSURANCE | Admitting: Podiatry

## 2013-12-31 ENCOUNTER — Telehealth: Payer: Self-pay | Admitting: Internal Medicine

## 2013-12-31 MED ORDER — CLARITHROMYCIN 500 MG PO TABS
500.0000 mg | ORAL_TABLET | Freq: Two times a day (BID) | ORAL | Status: DC
Start: 2013-12-31 — End: 2014-01-19

## 2013-12-31 NOTE — Telephone Encounter (Signed)
Per CDY: Biaxin 500mg  BID after meals # 20 x 0RF Pt aware. [facility] Nothing further needed.

## 2013-12-31 NOTE — Telephone Encounter (Signed)
Called spoke w/ pt. She c/o deep, prod cough-light yellow/light green phlem, wheezing (worse at night), chest tx x yesterday, She takes pred 20 mg daily. Pt requesting further recs, please advise Dr. Maple HudsonYoung thanks  No Known Allergies   Current Outpatient Prescriptions on File Prior to Visit  Medication Sig Dispense Refill  . albuterol (PROAIR HFA) 108 (90 BASE) MCG/ACT inhaler INHALE 2 PUFFS INTO THE LUNGS EVERY 4 HOURS AS NEEDED FOR WHEEZING ORSHORTNESS OF BREATH  3 Inhaler  3  . ALPRAZolam (XANAX) 0.25 MG tablet as needed.        Marland Kitchen. CALCIUM-VITAMIN D PO Take 1 tablet by mouth daily.        . Cholecalciferol (VITAMIN D) 1000 UNITS capsule Take 1,000 Units by mouth daily.        . furosemide (LASIX) 40 MG tablet Take 40 mg by mouth daily.        . iron polysaccharides (FERUS) 150 MG capsule Take 150 mg by mouth daily.        . mometasone-formoterol (DULERA) 100-5 MCG/ACT AERO Inhale 2 puffs into the lungs 2 (two) times daily. Rinse mouth well  3 Inhaler  3  . Multiple Vitamin (MULTIVITAMIN) capsule Take 1 capsule by mouth daily.        . NON FORMULARY 2cc mometasone 0.6mg  saline. nasonex once daily      . potassium chloride (KLOR-CON) 20 MEQ packet Take 20 mEq by mouth daily.        . predniSONE (DELTASONE) 1 MG tablet As directed for dose adjustment  100 tablet  3  . predniSONE (DELTASONE) 10 MG tablet 2 daily or as directed  180 tablet  3  . Probiotic Product (ALIGN PO) Take 1 tablet by mouth daily.        . sertraline (ZOLOFT) 100 MG tablet Take 150 mg by mouth daily.        . Sodium Chloride-Sodium Bicarb (AYR SALINE NASAL RINSE NA) by Nasal route 2 (two) times daily.        Marland Kitchen. spironolactone (ALDACTONE) 25 MG tablet Take 25 mg by mouth daily.        . valsartan (DIOVAN) 160 MG tablet Take 160 mg by mouth daily.        No current facility-administered medications on file prior to visit.

## 2014-01-19 ENCOUNTER — Encounter: Payer: Self-pay | Admitting: Internal Medicine

## 2014-01-19 ENCOUNTER — Ambulatory Visit (INDEPENDENT_AMBULATORY_CARE_PROVIDER_SITE_OTHER): Payer: PRIVATE HEALTH INSURANCE | Admitting: Internal Medicine

## 2014-01-19 VITALS — BP 132/78 | HR 110 | Ht 71.5 in | Wt 234.4 lb

## 2014-01-19 DIAGNOSIS — J45901 Unspecified asthma with (acute) exacerbation: Secondary | ICD-10-CM

## 2014-01-19 DIAGNOSIS — J209 Acute bronchitis, unspecified: Secondary | ICD-10-CM

## 2014-01-19 DIAGNOSIS — J441 Chronic obstructive pulmonary disease with (acute) exacerbation: Secondary | ICD-10-CM

## 2014-01-19 MED ORDER — AMOXICILLIN-POT CLAVULANATE 875-125 MG PO TABS: 1.0000 | ORAL_TABLET | Freq: Two times a day (BID) | ORAL | Status: DC

## 2014-01-19 NOTE — Patient Instructions (Signed)
Script augmentin sent   Please call for med refills and as needed

## 2014-01-19 NOTE — Assessment & Plan Note (Signed)
Syndrome of chronic airway inflammation, sinuses and bronchial tree. Steroid dependent. Sounds like residual infection. Discussed how to respond to medication intolerance. Plan- continue pressing for lowest sufficient steroid dose. Augmentin. Steroid taper slowly as tolerated.

## 2014-01-19 NOTE — Progress Notes (Signed)
Patient ID: Kelli Fleming, female    DOB: 28-Jan-1956, 58 y.o.   MRN: [id number]  HPI 11/14/10- 59 yoF never smoker with chronic rhinosinusitis/ surgery, asthmatic bronchitis.  Last here January 11, 20012. She has been off prednisone after slow taper, now off 5 days. No need for [facility] in 2 months. 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 She continues nasal antibiotics under [name] good care.  05/22/11-  25 yoF never smoker with chronic rhinosinusitis/ surgery, asthmatic bronchitis.  Had flu vaccine. Continues to work with [name]. Today she reports feeling very well, "the best in 3 years". She has been taking prednisone 5 mg daily since the summer. [name] suggested caution with any withdrawal from her recent exam was good. She continues daily Claritin and Dulera without need for rescue inhaler.  12/16/11- 47 yoF never smoker with chronic rhinosinusitis/ surgery, asthmatic bronchitis.  Dulera and albuterol inhalers, also, prednisone  and . Did well through the winter and got down to 4 mg daily maintenance prednisone. In the spring she had to increase to 5 mg daily. 2 weeks ago she began noting wheeze at night and for 5 days ago she began worse wheeze with cough. She started herself, as corrected, on a prednisone taper from 60 mg, now down to 40 mg daily. She started her inhaler. She has run out of [facility] but using her rescue inhaler twice a day. She is due for followup with [name] in 2 days. She has been using Afrin, 90 and his antifungal nasal treatment already started one week ago. She is blowing yellow from her nose but denies significant headache now.  07/15/12- 54 yoF never smoker with chronic rhinosinusitis/ surgery, asthmatic bronchitis.  FOLLOWS [id number] well with breathing-staying on 10 mg qd of prednisone. In October, 2013, she tried tapering from 5 mg daily to 4 mg daily but developed chest tightness, shortness of  breath and weakness-So she went back to 10.  I explained the difference between disease relapse and adrenal insufficiency/steroid withdrawal. She reports bone density test "good". Not needing rescue inhaler at all. Continues to follow with [name].  01/13/13- 56 yoF never smoker with chronic rhinosinusitis/ surgery, asthmatic bronchitis.  FOLLOWS FOR: pt reports breathing is doing GREAT!! Pt is back to  daily pred since last visit-- denies any other concerns at this time. We discussed steroid therapy again and the distinction between relapse of symptoms versus symptoms of adrenal insufficiency  07/21/13- 57 yoF never smoker with atypical chronic rhinosinusitis/ surgery, asthmatic bronchitis FOLLOWS [id number] on Prednisone 20 mg daily NPSG 10/11/12- AHI 0/ hr, mild snoring. Difficulty initiating sleep.  She never got below 17 mg daily prednisone since LOV. Would feel profoundly tired and head congestion. Dr [name] does annual bone density. Talked again about steroid side effects. She feels very well now, denying head or chest congestion, discharge, headache or cough. Able to work full time. Pending f/u w/ Dr [name] and knows she has polyps. Does use Dulera, occ Proair.  01/19/14- 57 yoF never smoker with atypical chronic rhinosinusitis/ polyps/ surgery, asthmatic bronchitis, Complicated by HBP FOLLOWS FOR: Pt had gotten down to 17 mg of Prednisone-got sick-yellow productive cough-called here and was given Biaxin Rx. Would like to never have Rx again(morning sickness feelings-added to allergy list); feels 90% better but continues to cough. Got slowly down to 17 mg prednisone. After about 10 days at that dose had acute bronchitis flare-green/yellow. Unclear if she caught something  extrinsic, or simple broke-through lower dose of pred to relapse her chronic bronchitis. Put up with N/V and diarrhea to complete 10 days biaxin w/o telling us of intolerance on this med. Now not quite clear,  still productive light yellow. Not sick, no F/S. Using Align- discussed probiotics.Sinuses feel controlled.  Dr Jacky KindleAronson checks bone density every 6 months. She broke bone in L foot dropping bottle on it.   Review of Systems-See HPI Constitutional:   No-   weight loss, night sweats, fevers, chills, fatigue, lassitude. HEENT:   No-  headaches, difficulty swallowing, tooth/dental problems, sore throat,       No-  sneezing, itching, ear ache, no-nasal congestion, post nasal drip,  CV:  No-   chest pain, orthopnea, PND, swelling in lower extremities, anasarca, dizziness, palpitations Resp: no- shortness of breath with exertion or at rest.              +productive cough,  No non-productive cough,  No- coughing up of blood.              No-   change in color of mucus.  No- wheezing.   Skin: No-   rash or lesions. GI:  No-   heartburn, indigestion, abdominal pain, nausea, vomiting,  GU: . MS:  No-   joint pain or swelling.  . Neuro-     nothing unusual Psych:  No- change in mood or affect. No depression or anxiety.  No memory loss.  Objective:   Physical Exam  General- Alert, Oriented, Affect-appropriate- always very pleasant, Distress- none acute;  Skin- rash-none, lesions- none, excoriation- none Lymphadenopathy- none Head- atraumatic            Eyes- Gross vision intact, PERRLA, conjunctivae clear secretions. Mild. Orbital edema.            Ears- Hearing, canals-normal            Nose- Clear anteriorly with polyps visible -nonocclusive. , no-Septal dev, mucus, erosion, perforation             Throat- Mallampati II , mucosa- red , drainage- none, tonsils- atrophic Neck- flexible , trachea midline, no stridor , thyroid nl, carotid no bruit Chest - symmetrical excursion , unlabored           Heart/CV- RRR , no murmur , no gallop  , no rub, nl s1 s2                           - JVD- none , edema- none, stasis changes- none, varices- none           Lung- unlabored, clear , dullness-none, rub-  none, cough-none, wheeze-none           Chest wall-  Abd-  Br/ Gen/ Rectal- Not done, not indicated Extrem- cyanosis- none, clubbing, none, atrophy- none, strength- nl Neuro- grossly intact to observation

## 2014-02-07 ENCOUNTER — Telehealth: Payer: Self-pay | Admitting: Internal Medicine

## 2014-02-07 MED ORDER — MOMETASONE FURO-FORMOTEROL FUM 100-5 MCG/ACT IN AERO: 2.0000 | INHALATION_SPRAY | Freq: Two times a day (BID) | RESPIRATORY_TRACT | Status: DC

## 2014-02-07 NOTE — Telephone Encounter (Signed)
lmomtcb x1 for pt Which mail order pharm?

## 2014-02-07 NOTE — Telephone Encounter (Signed)
Spoke with pt--requests refill of Dulera be sent to Mail Order Pharmacy-- [facility] 3 month supply sent to [facility] Requests also that a 30 day supply be sent to [facility] to cover any gap in therapy. 1 month supply sent to [facility] Nothing further needed.

## 2014-07-27 ENCOUNTER — Ambulatory Visit (INDEPENDENT_AMBULATORY_CARE_PROVIDER_SITE_OTHER)
Admission: RE | Admit: 2014-07-27 | Discharge: 2014-07-27 | Disposition: A | Discharge status: Home / Self Care | Payer: PRIVATE HEALTH INSURANCE | Source: Ambulatory Visit | Attending: Internal Medicine | Admitting: Internal Medicine

## 2014-07-27 ENCOUNTER — Encounter: Payer: Self-pay | Admitting: Internal Medicine

## 2014-07-27 ENCOUNTER — Ambulatory Visit (INDEPENDENT_AMBULATORY_CARE_PROVIDER_SITE_OTHER): Payer: PRIVATE HEALTH INSURANCE | Admitting: Internal Medicine

## 2014-07-27 ENCOUNTER — Telehealth: Payer: Self-pay | Admitting: Internal Medicine

## 2014-07-27 VITALS — BP 154/88 | HR 101 | Ht 71.5 in | Wt 245.0 lb

## 2014-07-27 DIAGNOSIS — J440 Chronic obstructive pulmonary disease with acute lower respiratory infection: Secondary | ICD-10-CM

## 2014-07-27 DIAGNOSIS — J441 Chronic obstructive pulmonary disease with (acute) exacerbation: Secondary | ICD-10-CM

## 2014-07-27 DIAGNOSIS — J328 Other chronic sinusitis: Secondary | ICD-10-CM

## 2014-07-27 DIAGNOSIS — J449 Chronic obstructive pulmonary disease, unspecified: Secondary | ICD-10-CM

## 2014-07-27 MED ORDER — PREDNISONE 1 MG PO TABS: ORAL_TABLET | ORAL | Status: DC

## 2014-07-27 NOTE — Telephone Encounter (Signed)
Notes Recorded by Karleen HampshireMichelle P Gay, CMA on 07/27/2014 at 2:57 PM lmtcb Notes Recorded by Waymon Budgelinton D Young, MD on 07/27/2014 at 2:04 PM CXR- looks good. Nothing looks active. Minor changes from chronic bronchitis but nothing more.  Pt aware of results.

## 2014-07-27 NOTE — Patient Instructions (Addendum)
Order - CXR  Dx chronic bronchitis  Try exhaling Dulera through your nose- see if that slight dusting of steroid into your nose helps you come down any farther on the prednisone  Script sent for prednisone 1 mg

## 2014-07-27 NOTE — Progress Notes (Signed)
Patient ID: Kelli Fleming, female    DOB: [birth year], 59 y.o.   MRN: [id number]  HPI 11/14/10- 79 yoF never smoker with chronic rhinosinusitis/ surgery, asthmatic bronchitis.  Last here January 11, 20012. She has been off prednisone after slow taper, now off 5 days. No need for [facility] in 2 months. 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 She continues nasal antibiotics under [name] good care.  05/22/11-  24 yoF never smoker with chronic rhinosinusitis/ surgery, asthmatic bronchitis.  Had flu vaccine. Continues to work with Dr. Annalee Fleming. Today she reports feeling very well, "the best in 3 years". She has been taking prednisone 5 mg daily since the summer. Dr. Annalee Fleming suggested caution with any withdrawal from her recent exam was good. She continues daily Claritin and Dulera without need for rescue inhaler.  12/16/11- 73 yoF never smoker with chronic rhinosinusitis/ surgery, asthmatic bronchitis.  Dulera and albuterol inhalers, also, prednisone  and . Did well through the winter and got down to 4 mg daily maintenance prednisone. In the spring she had to increase to 5 mg daily. 2 weeks ago she began noting wheeze at night and for 5 days ago she began worse wheeze with cough. She started herself, as corrected, on a prednisone taper from 60 mg, now down to 40 mg daily. She started her inhaler. She has run out of [facility] but using her rescue inhaler twice a day. She is due for followup with Dr. Stasia Fleming in 2 days. She has been using Afrin, 90 and his antifungal nasal treatment already started one week ago. She is blowing yellow from her nose but denies significant headache now.  07/15/12- 36 yoF never smoker with chronic rhinosinusitis/ surgery, asthmatic bronchitis.  FOLLOWS MVH:[id number] well with breathing-staying on 10 mg qd of prednisone. In October, 2013, she tried tapering from 5 mg daily to 4 mg daily but developed chest tightness, shortness of  breath and weakness-So she went back to 10.  I explained the difference between disease relapse and adrenal insufficiency/steroid withdrawal. She reports bone density test "good". Not needing rescue inhaler at all. Continues to follow with Dr. Stasia Fleming.  01/13/13- 56 yoF never smoker with chronic rhinosinusitis/ surgery, asthmatic bronchitis.  FOLLOWS FOR: pt reports breathing is doing GREAT!! Pt is back to  daily pred since last visit-- denies any other concerns at this time. We discussed steroid therapy again and the distinction between relapse of symptoms versus symptoms of adrenal insufficiency  07/21/13- 57 yoF never smoker with atypical chronic rhinosinusitis/ surgery, asthmatic bronchitis FOLLOWS [id number] on Prednisone 20 mg daily NPSG 10/11/12- AHI 0/ hr, mild snoring. Difficulty initiating sleep.  She never got below 17 mg daily prednisone since LOV. Would feel profoundly tired and head congestion. Dr Kelli Fleming does annual bone density. Talked again about steroid side effects. She feels very well now, denying head or chest congestion, discharge, headache or cough. Able to work full time. Pending f/u w/ Dr Kelli Fleming and knows she has polyps. Does use Dulera, occ Proair.  01/19/14- 57 yoF never smoker with atypical chronic rhinosinusitis/ polyps/ surgery, asthmatic bronchitis, Complicated by HBP FOLLOWS FOR: Pt had gotten down to 17 mg of Prednisone-got sick-yellow productive cough-called here and was given Biaxin Rx. Would like to never have Rx again(morning sickness feelings-added to allergy list); feels 90% better but continues to cough. Got slowly down to 17 mg prednisone. After about 10 days at that dose had acute bronchitis flare-green/yellow. Unclear if she caught something  extrinsic, or simple broke-through lower dose of pred to relapse her chronic bronchitis. Put up with N/V and diarrhea to complete 10 days biaxin w/o telling us of intolerance on this med. Now not quite clear,  still productive light yellow. Not sick, no F/S. Using Align- discussed probiotics.Sinuses feel controlled.  Dr Kelli KindleAronson checks bone density every 6 months. She broke bone in L foot dropping bottle on it.   07/27/14- 58 yoF never smoker with atypical chronic rhinosinusitis/ polyps/ surgery, asthmatic bronchitis, FOLLOWS FOR: Pt stated she had bronchitis over Christmas holiday but has improved since then. Pt c/o prod cough with clear mucus. Pt denies SOB and CP/tightness.  Very slowly tapering prednisone from 20 mg daily down to 16 mg daily. Resolved a bronchitis at Christmas. Still coughing some clear mucus  Dr. Annalee GentaShoemaker treating MRSA in sinuses for the past month with oral antibiotics. CXR 09/14/13 IMPRESSION: 1. Mild lung hyperexpansion of bronchitic change without acute cardiopulmonary disease. 2. Slight worsening of bibasilar opacities, left greater than right, favored to represent atelectasis or scar. Electronically Signed  By: Kelli Fleming M.D.  On: 09/14/2013 10:04  Review of Systems-See HPI Constitutional:   No-   weight loss, night sweats, fevers, chills, fatigue, lassitude. HEENT:   No-  headaches, difficulty swallowing, tooth/dental problems, sore throat,       No-  sneezing, itching, ear ache, no-nasal congestion, post nasal drip,  CV:  No-   chest pain, orthopnea, PND, swelling in lower extremities, anasarca, dizziness, palpitations Resp: no- shortness of breath with exertion or at rest.              +productive cough,  No non-productive cough,  No- coughing up of blood.              No-   change in color of mucus.  No- wheezing.   Skin: No-   rash or lesions. GI:  No-   heartburn, indigestion, abdominal pain, nausea, vomiting,  GU: . MS:  No-   joint pain or swelling.  . Neuro-     nothing unusual Psych:  No- change in mood or affect. No depression or anxiety.  No memory loss.  Objective:   Physical Exam  General- Alert, Oriented, Affect-appropriate- always very  pleasant, Distress- none acute;  Skin- rash-none, lesions- none, excoriation- none Lymphadenopathy- none Head- atraumatic            Eyes- Gross vision intact, PERRLA, conjunctivae clear secretions. Mild. Orbital edema.            Ears- Hearing, canals-normal            Nose- Clear anteriorly with polyps visible -nonocclusive. , no-Septal dev, mucus, erosion, perforation             Throat- Mallampati II , mucosa- red , drainage- none, tonsils- atrophic Neck- flexible , trachea midline, no stridor , thyroid nl, carotid no bruit Chest - symmetrical excursion , unlabored           Heart/CV- RRR , no murmur , no gallop  , no rub, nl s1 s2                           - JVD- none , edema- none, stasis changes- none, varices- none           Lung- unlabored, clear , dullness-none, rub- none, cough-none, wheeze-none           Chest wall-  Abd-  Br/  Gen/ Rectal- Not done, not indicated Extrem- cyanosis- none, clubbing, none, atrophy- none, strength- nl Neuro- grossly intact to observation

## 2014-07-31 NOTE — Assessment & Plan Note (Signed)
Resolving acute bronchitis exacerbation that she shared with her husband-probably viral to start with. We discussed process of very slow prednisone taper as tolerated. Plan-refill prednisone 1 mg tabs, [facility]

## 2014-07-31 NOTE — Assessment & Plan Note (Signed)
Nasal polyps seen intermittently. She continues care from ENT/Dr. Annalee GentaShoemaker

## 2014-08-01 ENCOUNTER — Telehealth: Payer: Self-pay | Admitting: Internal Medicine

## 2014-08-01 NOTE — Telephone Encounter (Signed)
Spoke with [place] at pharmacy, clarified prednisone rx per CY's recs below.  Nothing further needed.

## 2014-08-01 NOTE — Telephone Encounter (Signed)
Mark the 10 mg tabs "2 daily or as directed" Mark the 1 mg tabs "1 daily or as directed for weaning"

## 2014-08-01 NOTE — Telephone Encounter (Signed)
Pharmacy calling to verify dosage of prednisone, but I don't know how to explain pt's dosage: Pt is prescribed 1mg  tabs and 10mg  tabs, directions state to take 2 tabs daily or as directed.    Dr. Maple HudsonYoung, can you clarify how you wish this pt to continue with her prednisone?  Thanks!   Current Outpatient Prescriptions on File Prior to Visit  Medication Sig Dispense Refill  . albuterol (PROAIR HFA) 108 (90 BASE) MCG/ACT inhaler INHALE 2 PUFFS INTO THE LUNGS EVERY 4 HOURS AS NEEDED FOR WHEEZING ORSHORTNESS OF BREATH 3 Inhaler 3  . ALPRAZolam (XANAX) 0.25 MG tablet as needed.      Marland Kitchen. CALCIUM-VITAMIN D PO Take 1 tablet by mouth daily.      . Cholecalciferol (VITAMIN D) 1000 UNITS capsule Take 1,000 Units by mouth daily.      . furosemide (LASIX) 40 MG tablet Take 40 mg by mouth daily.      . iron polysaccharides (FERUS) 150 MG capsule Take 150 mg by mouth daily.      . mometasone-formoterol (DULERA) 100-5 MCG/ACT AERO Inhale 2 puffs into the lungs 2 (two) times daily. Rinse mouth well 1 Inhaler 0  . Multiple Vitamin (MULTIVITAMIN) capsule Take 1 capsule by mouth daily.      . potassium chloride (KLOR-CON) 20 MEQ packet Take 20 mEq by mouth daily.      . predniSONE (DELTASONE) 1 MG tablet As directed for dose adjustment 100 tablet 3  . predniSONE (DELTASONE) 10 MG tablet 2 daily or as directed (Patient taking differently: Pt taking 16mg  per day) 180 tablet 3  . Probiotic Product (ALIGN PO) Take 1 tablet by mouth daily.      . sertraline (ZOLOFT) 100 MG tablet Take 150 mg by mouth daily.      Marland Kitchen. spironolactone (ALDACTONE) 25 MG tablet Take 25 mg by mouth daily.      . valsartan (DIOVAN) 160 MG tablet Take 160 mg by mouth daily.      No current facility-administered medications on file prior to visit.

## 2014-09-29 ENCOUNTER — Other Ambulatory Visit: Payer: Self-pay | Admitting: Internal Medicine

## 2014-10-03 ENCOUNTER — Telehealth: Payer: Self-pay | Admitting: Internal Medicine

## 2014-10-03 MED ORDER — PREDNISONE 10 MG PO TABS: ORAL_TABLET | ORAL | Status: DC

## 2014-10-03 NOTE — Telephone Encounter (Signed)
Rx for prednisone 10 mg was refilled

## 2014-11-03 ENCOUNTER — Encounter: Payer: Self-pay | Admitting: Internal Medicine

## 2014-11-11 ENCOUNTER — Telehealth: Payer: Self-pay | Admitting: Internal Medicine

## 2014-11-11 MED ORDER — MOMETASONE FURO-FORMOTEROL FUM 100-5 MCG/ACT IN AERO: 2.0000 | INHALATION_SPRAY | Freq: Two times a day (BID) | RESPIRATORY_TRACT | Status: DC

## 2014-11-11 NOTE — Telephone Encounter (Signed)
Spoke with pt. [facility] This has been taken care of. Nothing further was needed.

## 2014-12-07 ENCOUNTER — Telehealth: Payer: Self-pay | Admitting: Internal Medicine

## 2014-12-07 MED ORDER — ALBUTEROL SULFATE HFA 108 (90 BASE) MCG/ACT IN AERS: INHALATION_SPRAY | RESPIRATORY_TRACT | Status: DC

## 2014-12-07 NOTE — Telephone Encounter (Signed)
Rx has been sent in. Pt is aware. Nothing further was needed. 

## 2015-02-01 ENCOUNTER — Ambulatory Visit (INDEPENDENT_AMBULATORY_CARE_PROVIDER_SITE_OTHER): Payer: PRIVATE HEALTH INSURANCE | Admitting: Internal Medicine

## 2015-02-01 ENCOUNTER — Encounter: Payer: Self-pay | Admitting: Internal Medicine

## 2015-02-01 VITALS — BP 124/68 | HR 103 | Ht 71.5 in | Wt 250.8 lb

## 2015-02-01 DIAGNOSIS — J441 Chronic obstructive pulmonary disease with (acute) exacerbation: Secondary | ICD-10-CM

## 2015-02-01 DIAGNOSIS — J328 Other chronic sinusitis: Secondary | ICD-10-CM | POA: Diagnosis not present

## 2015-02-01 DIAGNOSIS — J449 Chronic obstructive pulmonary disease, unspecified: Secondary | ICD-10-CM

## 2015-02-01 MED ORDER — PREDNISONE 10 MG PO TABS: ORAL_TABLET | ORAL | Status: DC

## 2015-02-01 MED ORDER — ALBUTEROL SULFATE HFA 108 (90 BASE) MCG/ACT IN AERS: INHALATION_SPRAY | RESPIRATORY_TRACT | Status: DC

## 2015-02-01 MED ORDER — PREDNISONE 1 MG PO TABS: ORAL_TABLET | ORAL | Status: DC

## 2015-02-01 MED ORDER — MOMETASONE FURO-FORMOTEROL FUM 100-5 MCG/ACT IN AERO: 2.0000 | INHALATION_SPRAY | Freq: Two times a day (BID) | RESPIRATORY_TRACT | Status: DC

## 2015-02-01 MED ORDER — FLUTICASONE PROPIONATE HFA 110 MCG/ACT IN AERO: INHALATION_SPRAY | RESPIRATORY_TRACT | Status: DC

## 2015-02-01 NOTE — Patient Instructions (Signed)
Refill scripts sent for prednisone and your inhalers  Sample Flovent 110 HFA steroid inhaler   Try using this for now instead of your evening dose of Dulera. You can always take the [facility] instead if you feel you need it.  Please call as needed

## 2015-02-01 NOTE — Progress Notes (Signed)
Patient ID: Kelli Fleming, female    DOB: 28-Jan-1956, 59 y.o.   MRN: [id number]  HPI 11/14/10- 59 yoF never smoker with chronic rhinosinusitis/ surgery, asthmatic bronchitis.  Last here January 11, 20012. She has been off prednisone after slow taper, now off 5 days. No need for [facility] in 2 months. 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 She continues nasal antibiotics under [name] good care.  05/22/11-  25 yoF never smoker with chronic rhinosinusitis/ surgery, asthmatic bronchitis.  Had flu vaccine. Continues to work with [name]. Today she reports feeling very well, "the best in 3 years". She has been taking prednisone 5 mg daily since the summer. [name] suggested caution with any withdrawal from her recent exam was good. She continues daily Claritin and Dulera without need for rescue inhaler.  12/16/11- 47 yoF never smoker with chronic rhinosinusitis/ surgery, asthmatic bronchitis.  Dulera and albuterol inhalers, also, prednisone  and . Did well through the winter and got down to 4 mg daily maintenance prednisone. In the spring she had to increase to 5 mg daily. 2 weeks ago she began noting wheeze at night and for 5 days ago she began worse wheeze with cough. She started herself, as corrected, on a prednisone taper from 60 mg, now down to 40 mg daily. She started her inhaler. She has run out of [facility] but using her rescue inhaler twice a day. She is due for followup with [name] in 2 days. She has been using Afrin, 90 and his antifungal nasal treatment already started one week ago. She is blowing yellow from her nose but denies significant headache now.  07/15/12- 54 yoF never smoker with chronic rhinosinusitis/ surgery, asthmatic bronchitis.  FOLLOWS WUJ:WJXBJ well with breathing-staying on 10 mg qd of prednisone. In October, 2013, she tried tapering from 5 mg daily to 4 mg daily but developed chest tightness, shortness of  breath and weakness-So she went back to 10.  I explained the difference between disease relapse and adrenal insufficiency/steroid withdrawal. She reports bone density test "good". Not needing rescue inhaler at all. Continues to follow with [name].  01/13/13- 56 yoF never smoker with chronic rhinosinusitis/ surgery, asthmatic bronchitis.  FOLLOWS FOR: pt reports breathing is doing GREAT!! Pt is back to  daily pred since last visit-- denies any other concerns at this time. We discussed steroid therapy again and the distinction between relapse of symptoms versus symptoms of adrenal insufficiency  07/21/13- 57 yoF never smoker with atypical chronic rhinosinusitis/ surgery, asthmatic bronchitis FOLLOWS YNW:GNFAOZHYQ on Prednisone 20 mg daily NPSG 10/11/12- AHI 0/ hr, mild snoring. Difficulty initiating sleep.  She never got below 17 mg daily prednisone since LOV. Would feel profoundly tired and head congestion. Dr [name] does annual bone density. Talked again about steroid side effects. She feels very well now, denying head or chest congestion, discharge, headache or cough. Able to work full time. Pending f/u w/ Dr [name] and knows she has polyps. Does use Dulera, occ Proair.  01/19/14- 57 yoF never smoker with atypical chronic rhinosinusitis/ polyps/ surgery, asthmatic bronchitis, Complicated by HBP FOLLOWS FOR: Pt had gotten down to 17 mg of Prednisone-got sick-yellow productive cough-called here and was given Biaxin Rx. Would like to never have Rx again(morning sickness feelings-added to allergy list); feels 90% better but continues to cough. Got slowly down to 17 mg prednisone. After about 10 days at that dose had acute bronchitis flare-green/yellow. Unclear if she caught something  extrinsic, or simple broke-through lower dose of pred to relapse her chronic bronchitis. Put up with N/V and diarrhea to complete 10 days biaxin w/o telling us of intolerance on this med. Now not quite clear,  still productive light yellow. Not sick, no F/S. Using Align- discussed probiotics.Sinuses feel controlled.  Dr [name] checks bone density every 6 months. She broke bone in L foot dropping bottle on it.   07/27/14- 58 yoF never smoker with atypical chronic rhinosinusitis/ polyps/ surgery, asthmatic bronchitis, FOLLOWS FOR: Pt stated she had bronchitis over Christmas holiday but has improved since then. Pt c/o prod cough with clear mucus. Pt denies SOB and CP/tightness.  Very slowly tapering prednisone from 20 mg daily down to 16 mg daily. Resolved a bronchitis at Christmas. Still coughing some clear mucus  [name] treating MRSA in sinuses for the past month with oral antibiotics. CXR 09/14/13 IMPRESSION: 1. Mild lung hyperexpansion of bronchitic change without acute cardiopulmonary disease. 2. Slight worsening of bibasilar opacities, left greater than right, favored to represent atelectasis or scar. Electronically Signed  By: Simonne Come M.D.  On: 09/14/2013 10:04  02/01/15- 58 yoF never smoker with atypical chronic rhinosinusitis/ polyps/ surgery, asthmatic bronchitis, FOLLOWS FOR: Pt states her breathing is doing well.  She continues maintenance prednisone currently at 19 mg daily and says she has felt fine. Lower doses of prednisone resulted in increased phlegm and chest tightness. Wheeze comes and goes. Her primary physician checked bone density and she understands issues of adrenal insufficiency. Rarely needs rescue inhaler. Uses Dulera maintenance for intervals when needed Chest x-ray reviewed with her CXR 07/27/14 FINDINGS: Cardiomediastinal silhouette is stable. No acute infiltrate or pleural effusion. No pulmonary edema. Mild hyperinflation again noted. IMPRESSION: No active cardiopulmonary disease. Mild hyperinflation again noted. Electronically Signed  By: Natasha Mead M.D.  On: 07/27/2014 10:457/16  Review of Systems-See HPI Constitutional:   No-   weight loss,  night sweats, fevers, chills, fatigue, lassitude. HEENT:   No-  headaches, difficulty swallowing, tooth/dental problems, sore throat,       No-  sneezing, itching, ear ache, no-nasal congestion, post nasal drip,  CV:  No-   chest pain, orthopnea, PND, swelling in lower extremities, anasarca, dizziness, palpitations Resp: no- shortness of breath with exertion or at rest.              +productive cough,  No non-productive cough,  No- coughing up of blood.              No-   change in color of mucus.  No- wheezing.   Skin: No-   rash or lesions. GI:  No-   heartburn, indigestion, abdominal pain, nausea, vomiting,  GU: . MS:  No-   joint pain or swelling.  . Neuro-     nothing unusual Psych:  No- change in mood or affect. No depression or anxiety.  No memory loss.  Objective:   Physical Exam  General- Alert, Oriented, Affect-appropriate- always very pleasant, Distress- none acute;  Skin- rash-none, lesions- none, excoriation- none Lymphadenopathy- none Head- atraumatic            Eyes- Gross vision intact, PERRLA, conjunctivae clear secretions. Mild. Orbital edema.            Ears- Hearing, canals-normal            Nose- Clear anteriorly with polyps visible -nonocclusive. , no-Septal dev, mucus, erosion, perforation             Throat- Mallampati II ,  mucosa- red , drainage- none, tonsils- atrophic Neck- flexible , trachea midline, no stridor , thyroid nl, carotid no bruit Chest - symmetrical excursion , unlabored           Heart/CV- RRR , no murmur , no gallop  , no rub, nl s1 s2                           - JVD- none , edema- none, stasis changes- none, varices- none           Lung- unlabored, clear , dullness-none, rub- none, cough-none, wheeze-none           Chest wall-  Abd-  Br/ Gen/ Rectal- Not done, not indicated Extrem- cyanosis- none, clubbing, none, atrophy- none, strength- nl Neuro- grossly intact to observation

## 2015-02-27 ENCOUNTER — Telehealth: Payer: Self-pay | Admitting: Internal Medicine

## 2015-02-27 NOTE — Telephone Encounter (Signed)
Patient's insurance will not cover ProAir.. Will cover Ventolin.  Pharmacy given approval to fill Ventolin instead of ProAir. Nothing further needed.

## 2015-04-23 NOTE — Assessment & Plan Note (Addendum)
Maintenance prednisone around 19 mg daily has been necessary but sufficient. Only interval use of rescue inhaler and Elwin SleightDulera has been required this year. I don't know who can get steroids low enough to explore whether IgE or eosinophilia with support trial of Nucala

## 2015-04-23 NOTE — Assessment & Plan Note (Signed)
Upper and lower airway inflammation is controlled with maintenance prednisone

## 2015-07-18 ENCOUNTER — Other Ambulatory Visit: Payer: Self-pay | Admitting: Internal Medicine

## 2015-08-02 ENCOUNTER — Ambulatory Visit (INDEPENDENT_AMBULATORY_CARE_PROVIDER_SITE_OTHER): Payer: 59 | Admitting: Internal Medicine

## 2015-08-02 ENCOUNTER — Encounter: Payer: Self-pay | Admitting: Internal Medicine

## 2015-08-02 VITALS — BP 140/80 | HR 101 | Ht 72.0 in | Wt 245.4 lb

## 2015-08-02 DIAGNOSIS — J328 Other chronic sinusitis: Secondary | ICD-10-CM | POA: Diagnosis not present

## 2015-08-02 MED ORDER — VERAPAMIL HCL 80 MG PO TABS: 80.0000 mg | ORAL_TABLET | Freq: Three times a day (TID) | ORAL | Status: DC

## 2015-08-02 NOTE — Progress Notes (Signed)
Patient ID: Kelli Fleming, female    DOB: 28-Jan-1956, 60 y.o.   MRN: [id number]  HPI 11/14/10- 59 yoF never smoker with chronic rhinosinusitis/ surgery, asthmatic bronchitis.  Last here January 11, 20012. She has been off prednisone after slow taper, now off 5 days. No need for [facility] in 2 months. 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 She continues nasal antibiotics under [name] good care.  05/22/11-  25 yoF never smoker with chronic rhinosinusitis/ surgery, asthmatic bronchitis.  Had flu vaccine. Continues to work with Dr. Annalee Fleming. Today she reports feeling very well, "the best in 3 years". She has been taking prednisone 5 mg daily since the summer. Dr. Annalee Fleming suggested caution with any withdrawal from her recent exam was good. She continues daily Claritin and Dulera without need for rescue inhaler.  12/16/11- 47 yoF never smoker with chronic rhinosinusitis/ surgery, asthmatic bronchitis.  Dulera and albuterol inhalers, also, prednisone  and . Did well through the winter and got down to 4 mg daily maintenance prednisone. In the spring she had to increase to 5 mg daily. 2 weeks ago she began noting wheeze at night and for 5 days ago she began worse wheeze with cough. She started herself, as corrected, on a prednisone taper from 60 mg, now down to 40 mg daily. She started her inhaler. She has run out of [facility] but using her rescue inhaler twice a day. She is due for followup with Dr. Stasia Fleming in 2 days. She has been using Afrin, 90 and his antifungal nasal treatment already started one week ago. She is blowing yellow from her nose but denies significant headache now.  07/15/12- 54 yoF never smoker with chronic rhinosinusitis/ surgery, asthmatic bronchitis.  FOLLOWS WUJ:WJXBJ well with breathing-staying on 10 mg qd of prednisone. In October, 2013, she tried tapering from 5 mg daily to 4 mg daily but developed chest tightness, shortness of  breath and weakness-So she went back to 10.  I explained the difference between disease relapse and adrenal insufficiency/steroid withdrawal. She reports bone density test "good". Not needing rescue inhaler at all. Continues to follow with Dr. Stasia Fleming.  01/13/13- 56 yoF never smoker with chronic rhinosinusitis/ surgery, asthmatic bronchitis.  FOLLOWS FOR: pt reports breathing is doing GREAT!! Pt is back to  daily pred since last visit-- denies any other concerns at this time. We discussed steroid therapy again and the distinction between relapse of symptoms versus symptoms of adrenal insufficiency  07/21/13- 57 yoF never smoker with atypical chronic rhinosinusitis/ surgery, asthmatic bronchitis FOLLOWS YNW:GNFAOZHYQ on Prednisone 20 mg daily NPSG 10/11/12- AHI 0/ hr, mild snoring. Difficulty initiating sleep.  She never got below 17 mg daily prednisone since LOV. Would feel profoundly tired and head congestion. Dr Kelli Fleming does annual bone density. Talked again about steroid side effects. She feels very well now, denying head or chest congestion, discharge, headache or cough. Able to work full time. Pending f/u w/ Dr Kelli Fleming and knows she has polyps. Does use Dulera, occ Proair.  01/19/14- 57 yoF never smoker with atypical chronic rhinosinusitis/ polyps/ surgery, asthmatic bronchitis, Complicated by HBP FOLLOWS FOR: Pt had gotten down to 17 mg of Prednisone-got sick-yellow productive cough-called here and was given Biaxin Rx. Would like to never have Rx again(morning sickness feelings-added to allergy list); feels 90% better but continues to cough. Got slowly down to 17 mg prednisone. After about 10 days at that dose had acute bronchitis flare-green/yellow. Unclear if she caught something  extrinsic, or simple broke-through lower dose of pred to relapse her chronic bronchitis. Put up with N/V and diarrhea to complete 10 days biaxin w/o telling us of intolerance on this med. Now not quite clear,  still productive light yellow. Not sick, no F/S. Using Align- discussed probiotics.Sinuses feel controlled.  Dr Kelli Fleming checks bone density every 6 months. She broke bone in L foot dropping bottle on it.   07/27/14- 58 yoF never smoker with atypical chronic rhinosinusitis/ polyps/ surgery, asthmatic bronchitis, FOLLOWS FOR: Pt stated she had bronchitis over Christmas holiday but has improved since then. Pt c/o prod cough with clear mucus. Pt denies SOB and CP/tightness.  Very slowly tapering prednisone from 20 mg daily down to 16 mg daily. Resolved a bronchitis at Christmas. Still coughing some clear mucus  Dr. Annalee Fleming treating MRSA in sinuses for the past month with oral antibiotics. CXR 09/14/13 IMPRESSION: 1. Mild lung hyperexpansion of bronchitic change without acute cardiopulmonary disease. 2. Slight worsening of bibasilar opacities, left greater than right, favored to represent atelectasis or scar. Electronically Signed  By: Kelli Fleming M.D.  On: 09/14/2013 10:04  02/01/15- 58 yoF never smoker with atypical chronic rhinosinusitis/ polyps/ surgery, asthmatic bronchitis, FOLLOWS FOR: Pt states her breathing is doing well.  She continues maintenance prednisone currently at 19 mg daily and says she has felt fine. Lower doses of prednisone resulted in increased phlegm and chest tightness. Wheeze comes and goes. Her primary physician checked bone density and she understands issues of adrenal insufficiency. Rarely needs rescue inhaler. Uses Dulera maintenance for intervals when needed Chest x-ray reviewed with her CXR 07/27/14 FINDINGS: Cardiomediastinal silhouette is stable. No acute infiltrate or pleural effusion. No pulmonary edema. Mild hyperinflation again noted. IMPRESSION: No active cardiopulmonary disease. Mild hyperinflation again noted. Electronically Signed  By: Kelli Fleming M.D.  On: 07/27/2014 10:457/16  08/02/2015-60 year old female never smoker with atypical chronic  rhinosinusitis/polyps/surgery, asthmatic bronchitis FOLLOW FOR: 6 month follow up; still has a little cough.   Dr. Annalee Fleming treated with Avelox for 2 weeks for acute sinus exacerbation yielding very purulent nasal secretions with Neti pot. Slight lingering cough. Continues maintenance prednisone 18-20 mg daily, Flovent 110, uses rescue inhaler most days currently. We discussed recent literature about use of verapamil to reduce rhinosinusitis inflammation.  Review of Systems-See HPI Constitutional:   No-   weight loss, night sweats, fevers, chills, fatigue, lassitude. HEENT:   No-  headaches, difficulty swallowing, tooth/dental problems, sore throat,       No-  sneezing, itching, ear ache, no-nasal congestion, post nasal drip,  CV:  No-   chest pain, orthopnea, PND, swelling in lower extremities, anasarca, dizziness, palpitations Resp: no- shortness of breath with exertion or at rest.              +productive cough,  No non-productive cough,  No- coughing up of blood.              No-   change in color of mucus.  No- wheezing.   Skin: No-   rash or lesions. GI:  No-   heartburn, indigestion, abdominal pain, nausea, vomiting,  GU: . MS:  No-   joint pain or swelling.  . Neuro-     nothing unusual Psych:  No- change in mood or affect. No depression or anxiety.  No memory loss.  Objective:   Physical Exam  General- Alert, Oriented, Affect-appropriate- always very pleasant, Distress- none acute; + overweight Skin- + steroid-type ecchymoses on forearms Lymphadenopathy- none Head- atraumatic  Eyes- Gross vision intact, PERRLA, conjunctivae clear secretions. Mild. Orbital edema.            Ears- Hearing, canals-normal            Nose- Clear anteriorly with polyps visible -nonocclusive. , no-Septal dev, mucus, erosion, perforation             Throat- Mallampati II , mucosa- red , drainage- none, tonsils- atrophic Neck- flexible , trachea midline, no stridor , thyroid nl, carotid no  bruit Chest - symmetrical excursion , unlabored           Heart/CV- RRR , no murmur , no gallop  , no rub, nl s1 s2                           - JVD- none , edema- none, stasis changes- none, varices- none           Lung- unlabored, clear , dullness-none, rub- none, cough-none, wheeze-none           Chest wall-  Abd-  Br/ Gen/ Rectal- Not done, not indicated Extrem- cyanosis- none, clubbing, none, atrophy- none, strength- nl Neuro- grossly intact to observation

## 2015-08-02 NOTE — Patient Instructions (Signed)
Script adding verapamil 80 mg three times daily to see if it can help reduce your chronic sinus disease and let us reduce your prednisone

## 2015-08-02 NOTE — Assessment & Plan Note (Signed)
I discussed "off label" use of verapamil in very small study for this problem as a way of reducing inflammation in the nose and sinuses. We reviewed side effects of verapamil in [facility]. She is game to give it a try in hopes of reducing systemic steroid exposure. Her blood pressure is high enough now to tolerate additional medication. Daliresp doesn't seem to have any effect so she will run out and stop this. Plan-verapamil 80 mg 3 times a day for 1-2 month trial

## 2015-08-16 ENCOUNTER — Other Ambulatory Visit: Payer: Self-pay

## 2015-08-16 DIAGNOSIS — Z1231 Encounter for screening mammogram for malignant neoplasm of breast: Secondary | ICD-10-CM

## 2015-09-06 ENCOUNTER — Ambulatory Visit
Admission: RE | Admit: 2015-09-06 | Discharge: 2015-09-06 | Disposition: A | Discharge status: Home / Self Care | Payer: 59 | Source: Ambulatory Visit

## 2015-09-06 DIAGNOSIS — Z1231 Encounter for screening mammogram for malignant neoplasm of breast: Secondary | ICD-10-CM

## 2015-09-08 ENCOUNTER — Other Ambulatory Visit: Payer: Self-pay | Admitting: Obstetrics & Gynecology

## 2015-09-08 DIAGNOSIS — R928 Other abnormal and inconclusive findings on diagnostic imaging of breast: Secondary | ICD-10-CM

## 2015-09-15 ENCOUNTER — Ambulatory Visit
Admission: RE | Admit: 2015-09-15 | Discharge: 2015-09-15 | Disposition: A | Discharge status: Home / Self Care | Payer: 59 | Source: Ambulatory Visit | Attending: Obstetrics & Gynecology | Admitting: Obstetrics & Gynecology

## 2015-09-15 DIAGNOSIS — R928 Other abnormal and inconclusive findings on diagnostic imaging of breast: Secondary | ICD-10-CM

## 2015-10-16 ENCOUNTER — Telehealth: Payer: Self-pay | Admitting: Internal Medicine

## 2015-10-16 MED ORDER — PREDNISONE 1 MG PO TABS: ORAL_TABLET | ORAL | Status: DC

## 2015-10-16 MED ORDER — PREDNISONE 10 MG PO TABS: ORAL_TABLET | ORAL | Status: DC

## 2015-10-16 NOTE — Telephone Encounter (Signed)
Spoke with pt. She is needing refills sent in for Prednisone 1mg  and 10mg . Pt needs a 90 day supply sent to [facility] and a 30 day supply sent to her local pharmacy. These have been sent in. Nothing further was needed.

## 2015-11-07 ENCOUNTER — Telehealth: Payer: Self-pay | Admitting: Internal Medicine

## 2015-11-07 MED ORDER — VERAPAMIL HCL 80 MG PO TABS: 80.0000 mg | ORAL_TABLET | Freq: Three times a day (TID) | ORAL | Status: DC

## 2015-11-07 NOTE — Telephone Encounter (Signed)
LMTCB

## 2015-11-07 NOTE — Telephone Encounter (Signed)
Patient returning our call-prm  °

## 2015-11-07 NOTE — Telephone Encounter (Signed)
Patient Instructions     Script adding verapamil 80 mg three times daily to see if it can help reduce your chronic sinus disease and let us reduce your prednisone   Called spoke with pt. She states she needs a [facility] on [facility] She states she was in a trial program and needs this before Wednesday or she will be completely out. I explained to her that I would need to send a message for approval before I could send the rx. She voiced understanding and had no further questions.   Spoke with CY and informed him of situation Per verbal order Okay to refill #90 with 2 refills  Called spoke with pt. Informed her that rx has been approved and sent. She voiced understanding and had no further questions. Rx sent. Nothing further needed.

## 2015-11-16 ENCOUNTER — Encounter: Payer: Self-pay | Admitting: Internal Medicine

## 2015-11-16 ENCOUNTER — Ambulatory Visit (INDEPENDENT_AMBULATORY_CARE_PROVIDER_SITE_OTHER): Payer: 59 | Admitting: Internal Medicine

## 2015-11-16 VITALS — BP 114/66 | HR 96 | Ht 72.0 in | Wt 239.8 lb

## 2015-11-16 DIAGNOSIS — J441 Chronic obstructive pulmonary disease with (acute) exacerbation: Secondary | ICD-10-CM | POA: Diagnosis not present

## 2015-11-16 DIAGNOSIS — J328 Other chronic sinusitis: Secondary | ICD-10-CM | POA: Diagnosis not present

## 2015-11-16 MED ORDER — VERAPAMIL HCL 80 MG PO TABS: 80.0000 mg | ORAL_TABLET | Freq: Three times a day (TID) | ORAL | Status: DC

## 2015-11-16 NOTE — Assessment & Plan Note (Signed)
Coincident with starting verapamil, she noted marked improvement in her chronic rhinosinusitis almost immediately, permitting the lowest steroid doses she has required in years. She is tapering steroids very slowly as directed without recognized problems. Her mother had Addison's disease so she understands the issue. Plan-if she is stable by the time she finally tapers off prednisone, then she can start very slowly reducing verapamil as tolerated. Apparently he has also help her blood pressure control, which she will discuss with her primary physician.

## 2015-11-16 NOTE — Progress Notes (Signed)
Patient ID: Kelli Fleming, female    DOB: 28-Jan-1956, 60 y.o.   MRN: [id number]  HPI 11/14/10- 59 yoF never smoker with chronic rhinosinusitis/ surgery, asthmatic bronchitis.  Last here January 11, 20012. She has been off prednisone after slow taper, now off 5 days. No need for [facility] in 2 months. 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 She continues nasal antibiotics under [name] good care.  05/22/11-  25 yoF never smoker with chronic rhinosinusitis/ surgery, asthmatic bronchitis.  Had flu vaccine. Continues to work with [name]. Today she reports feeling very well, "the best in 3 years". She has been taking prednisone 5 mg daily since the summer. [name] suggested caution with any withdrawal from her recent exam was good. She continues daily Claritin and Dulera without need for rescue inhaler.  12/16/11- 47 yoF never smoker with chronic rhinosinusitis/ surgery, asthmatic bronchitis.  Dulera and albuterol inhalers, also, prednisone  and . Did well through the winter and got down to 4 mg daily maintenance prednisone. In the spring she had to increase to 5 mg daily. 2 weeks ago she began noting wheeze at night and for 5 days ago she began worse wheeze with cough. She started herself, as corrected, on a prednisone taper from 60 mg, now down to 40 mg daily. She started her inhaler. She has run out of [facility] but using her rescue inhaler twice a day. She is due for followup with [name] in 2 days. She has been using Afrin, 90 and his antifungal nasal treatment already started one week ago. She is blowing yellow from her nose but denies significant headache now.  07/15/12- 54 yoF never smoker with chronic rhinosinusitis/ surgery, asthmatic bronchitis.  FOLLOWS WUJ:WJXBJ well with breathing-staying on 10 mg qd of prednisone. In October, 2013, she tried tapering from 5 mg daily to 4 mg daily but developed chest tightness, shortness of  breath and weakness-So she went back to 10.  I explained the difference between disease relapse and adrenal insufficiency/steroid withdrawal. She reports bone density test "good". Not needing rescue inhaler at all. Continues to follow with [name].  01/13/13- 56 yoF never smoker with chronic rhinosinusitis/ surgery, asthmatic bronchitis.  FOLLOWS FOR: pt reports breathing is doing GREAT!! Pt is back to  daily pred since last visit-- denies any other concerns at this time. We discussed steroid therapy again and the distinction between relapse of symptoms versus symptoms of adrenal insufficiency  07/21/13- 57 yoF never smoker with atypical chronic rhinosinusitis/ surgery, asthmatic bronchitis FOLLOWS YNW:GNFAOZHYQ on Prednisone 20 mg daily NPSG 10/11/12- AHI 0/ hr, mild snoring. Difficulty initiating sleep.  She never got below 17 mg daily prednisone since LOV. Would feel profoundly tired and head congestion. Dr [name] does annual bone density. Talked again about steroid side effects. She feels very well now, denying head or chest congestion, discharge, headache or cough. Able to work full time. Pending f/u w/ Dr [name] and knows she has polyps. Does use Dulera, occ Proair.  01/19/14- 57 yoF never smoker with atypical chronic rhinosinusitis/ polyps/ surgery, asthmatic bronchitis, Complicated by HBP FOLLOWS FOR: Pt had gotten down to 17 mg of Prednisone-got sick-yellow productive cough-called here and was given Biaxin Rx. Would like to never have Rx again(morning sickness feelings-added to allergy list); feels 90% better but continues to cough. Got slowly down to 17 mg prednisone. After about 10 days at that dose had acute bronchitis flare-green/yellow. Unclear if she caught something  extrinsic, or simple broke-through lower dose of pred to relapse her chronic bronchitis. Put up with N/V and diarrhea to complete 10 days biaxin w/o telling us of intolerance on this med. Now not quite clear,  still productive light yellow. Not sick, no F/S. Using Align- discussed probiotics.Sinuses feel controlled.  Dr Jacky KindleAronson checks bone density every 6 months. She broke bone in L foot dropping bottle on it.   07/27/14- 58 yoF never smoker with atypical chronic rhinosinusitis/ polyps/ surgery, asthmatic bronchitis, FOLLOWS FOR: Pt stated she had bronchitis over Christmas holiday but has improved since then. Pt c/o prod cough with clear mucus. Pt denies SOB and CP/tightness.  Very slowly tapering prednisone from 20 mg daily down to 16 mg daily. Resolved a bronchitis at Christmas. Still coughing some clear mucus  Dr. Annalee GentaShoemaker treating MRSA in sinuses for the past month with oral antibiotics. CXR 09/14/13 IMPRESSION: 1. Mild lung hyperexpansion of bronchitic change without acute cardiopulmonary disease. 2. Slight worsening of bibasilar opacities, left greater than right, favored to represent atelectasis or scar. Electronically Signed  By: [name]  On: 09/14/2013 10:04  02/01/15- 58 yoF never smoker with atypical chronic rhinosinusitis/ polyps/ surgery, asthmatic bronchitis, FOLLOWS FOR: Pt states her breathing is doing well.  She continues maintenance prednisone currently at 19 mg daily and says she has felt fine. Lower doses of prednisone resulted in increased phlegm and chest tightness. Wheeze comes and goes. Her primary physician checked bone density and she understands issues of adrenal insufficiency. Rarely needs rescue inhaler. Uses Dulera maintenance for intervals when needed Chest x-ray reviewed with her CXR 07/27/14 FINDINGS: Cardiomediastinal silhouette is stable. No acute infiltrate or pleural effusion. No pulmonary edema. Mild hyperinflation again noted. IMPRESSION: No active cardiopulmonary disease. Mild hyperinflation again noted. Electronically Signed  By: [name] M.D.  On: 07/27/2014 10:457/16  08/02/2015-60 year old female never smoker with atypical chronic  rhinosinusitis/polyps/surgery, asthmatic bronchitis FOLLOW FOR: 6 month follow up; still has a little cough.   Dr. Annalee GentaShoemaker treated with Avelox for 2 weeks for acute sinus exacerbation yielding very purulent nasal secretions with Neti pot. Slight lingering cough. Continues maintenance prednisone 18-20 mg daily, Flovent 110, uses rescue inhaler most days currently. We discussed recent literature about use of verapamil to reduce rhinosinusitis inflammation.  11/16/2015-60 year old female never smoker with atypical chronic rhinosinusitis/polyps/surgery, asthmatic bronchitis We had given her a trial of verapamil 80 mg 3 times a day based on a recent report that this drug can block an inflammatory protein and chronic nasal inflammation of this type. She continued prednisone maintenance. FOLLOWS FOR: Pt states she is doing well overall with her sinus issues. We had given trial verapamil based on a report that it reduced "substance P"-an inflammatory mediator in the nasal mucosa-resulting in marked reduction of chronic rhinosinusitis in the study group She has tolerated verapamil very well and noted a dramatic reduction in sinus inflammation. She has slowly tapered off prednisone, down to 5 mg daily, by 1 mg per week. She has not needed to use her inhalers in a month, reflecting improvement in lower respiratory tract as well. She is very pleased.  Review of Systems-See HPI Constitutional:   No-   weight loss, night sweats, fevers, chills, fatigue, lassitude. HEENT:   No-  headaches, difficulty swallowing, tooth/dental problems, sore throat,       No-  sneezing, itching, ear ache, no-nasal congestion, post nasal drip,  CV:  No-   chest pain, orthopnea, PND, swelling in lower extremities, anasarca, dizziness, palpitations Resp: no-  shortness of breath with exertion or at rest.              +productive cough,  No non-productive cough,  No- coughing up of blood.              No-   change in color of mucus.   No- wheezing.   Skin: No-   rash or lesions. GI:  No-   heartburn, indigestion, abdominal pain, nausea, vomiting,  GU: . MS:  No-   joint pain or swelling.  . Neuro-     nothing unusual Psych:  No- change in mood or affect. No depression or anxiety.  No memory loss.  Objective:   Physical Exam  General- Alert, Oriented, Affect-appropriate- always very pleasant, Distress- none acute; + overweight Skin- + steroid-type ecchymoses on forearms Lymphadenopathy- none Head- atraumatic            Eyes- Gross vision intact, PERRLA, conjunctivae clear secretions. Mild. Orbital edema.            Ears- Hearing, canals-normal            Nose- Clear anteriorly with polyps visible -nonocclusive. , no-Septal dev, mucus, erosion, perforation             Throat- Mallampati II , mucosa- red , drainage- none, tonsils- atrophic Neck- flexible , trachea midline, no stridor , thyroid nl, carotid no bruit Chest - symmetrical excursion , unlabored           Heart/CV- RRR , no murmur , no gallop  , no rub, nl s1 s2                           - JVD- none , edema- none, stasis changes- none, varices- none           Lung- unlabored, clear , dullness-none, rub- none, cough-none, wheeze-none           Chest wall-  Abd-  Br/ Gen/ Rectal- Not done, not indicated Extrem- cyanosis- none, clubbing, none, atrophy- none, strength- nl, + brace on right ankle after fracture Neuro- grossly intact to observation

## 2015-11-16 NOTE — Patient Instructions (Signed)
Really glad you have done so well.  Suggest: continue slow prednisone taper as tolerated, until off.  If you are feeling well after a couple of weeks off prednisone, you may choose to drop a dose of verapamil, so that you are only taking 80 mg twice daily. If you feel well, you can  keep slowly coming off it. You and Dr Jacky KindleAronson might also just decide to continue it as part of your blood pressure management.   Refill sent mail-order for verapamil

## 2015-11-16 NOTE — Assessment & Plan Note (Signed)
Improved, not requiring inhalers. This may reflect improvement in upper airway disease associated with verapamil use.

## 2015-12-11 ENCOUNTER — Telehealth: Payer: Self-pay | Admitting: Internal Medicine

## 2015-12-11 MED ORDER — AMOXICILLIN-POT CLAVULANATE 875-125 MG PO TABS: 1.0000 | ORAL_TABLET | Freq: Two times a day (BID) | ORAL | Status: DC

## 2015-12-11 NOTE — Telephone Encounter (Signed)
Pt aware of rec's per CY Refused cough syrup states it makes her feel loopy and she prefers to not take it.  [facility]. Nothing further needed.

## 2015-12-11 NOTE — Telephone Encounter (Signed)
Suggest increase prednisone to 40 mg daily x 3 days, then back to 20 mg daily and then slow taper as able.  Offer cough syrup prometh codeine 240 ml    5 ml evbery 6 hours if needed            Augmentin 875 mg, # 14, 1 twice daily

## 2015-12-11 NOTE — Telephone Encounter (Signed)
Pt c/o non-productive deep cough x 6 days.  Pt denies any chest tightness/CP. Unable to sleep d/t coughing. Pt has Prednisone on hand and is wanting to know how she needs to take it given her symptoms.  Currently taking 20mg  daily (started this dose on Wednesday 5/31) Requests something be called into chart - anything other than Biaxin.   Allergies  Allergen Reactions  . Biaxin [Clarithromycin] Other (See Comments)    "morning sickness feelings"     Medication List       This list is accurate as of: 12/11/15  8:48 AM.  Always use your most recent med list.               albuterol 108 (90 Base) MCG/ACT inhaler  Commonly known as:  PROAIR HFA  INHALE 2 PUFFS INTO THE LUNGS EVERY 4 HOURS AS NEEDED FOR WHEEZING ORSHORTNESS OF BREATH     ALIGN PO  Take 1 tablet by mouth daily.     ALPRAZolam 0.25 MG tablet  Commonly known as:  XANAX  as needed.     Biotin 5000 MCG Caps  Take 1 capsule by mouth daily.     CALCIUM-VITAMIN D PO  Take 1 tablet by mouth daily.     furosemide 40 MG tablet  Commonly known as:  LASIX  Take 40 mg by mouth daily.     GLUCOSAMINE CHONDR 1500 COMPLX Caps  Take 1 capsule by mouth daily.     KLOR-CON 20 MEQ packet  Generic drug:  potassium chloride  Take 20 mEq by mouth daily.     multivitamin capsule  Take 1 capsule by mouth daily.     predniSONE 1 MG tablet  Commonly known as:  DELTASONE  Take as directed for dose  adjustment     sertraline 100 MG tablet  Commonly known as:  ZOLOFT  Take 150 mg by mouth daily.     spironolactone 25 MG tablet  Commonly known as:  ALDACTONE  Take 25 mg by mouth daily.     valsartan 160 MG tablet  Commonly known as:  DIOVAN  Take 320 mg by mouth daily.     verapamil 80 MG tablet  Commonly known as:  CALAN  Take 1 tablet (80 mg total) by mouth 3 (three) times daily.     Vitamin D3 1000 units Caps  Take 1 capsule by mouth daily.       Please advise Dr Maple HudsonYoung. Thanks.

## 2016-01-09 ENCOUNTER — Other Ambulatory Visit: Payer: Self-pay | Admitting: Internal Medicine

## 2016-02-27 ENCOUNTER — Telehealth: Payer: Self-pay | Admitting: Internal Medicine

## 2016-02-27 DIAGNOSIS — J328 Other chronic sinusitis: Secondary | ICD-10-CM

## 2016-02-27 MED ORDER — VERAPAMIL HCL 80 MG PO TABS: 80.0000 mg | ORAL_TABLET | Freq: Three times a day (TID) | ORAL | 3 refills | Status: AC

## 2016-02-27 MED ORDER — VERAPAMIL HCL 80 MG PO TABS: 80.0000 mg | ORAL_TABLET | Freq: Three times a day (TID) | ORAL | 3 refills | Status: DC

## 2016-02-27 NOTE — Telephone Encounter (Signed)
Patient requesting a refill on Verapamil for 90 day supply to be sent to mail order.  Are you okay with refilling this medication or do you want her to get it through Dr. Lanell MatarAronson's office?   Please advise.  Allergies  Allergen Reactions  . Biaxin [Clarithromycin] Other (See Comments)    "morning sickness feelings"   Current Outpatient Prescriptions on File Prior to Visit  Medication Sig Dispense Refill  . albuterol (PROAIR HFA) 108 (90 BASE) MCG/ACT inhaler INHALE 2 PUFFS INTO THE LUNGS EVERY 4 HOURS AS NEEDED FOR WHEEZING ORSHORTNESS OF BREATH 3 Inhaler 3  . ALPRAZolam (XANAX) 0.25 MG tablet as needed.      Marland Kitchen. amoxicillin-clavulanate (AUGMENTIN) 875-125 MG tablet Take 1 tablet by mouth 2 (two) times daily. 14 tablet 0  . Biotin 5000 MCG CAPS Take 1 capsule by mouth daily.    Marland Kitchen. CALCIUM-VITAMIN D PO Take 1 tablet by mouth daily.      . Cholecalciferol (VITAMIN D3) 1000 units CAPS Take 1 capsule by mouth daily.    . furosemide (LASIX) 40 MG tablet Take 40 mg by mouth daily.      . Glucosamine-Chondroit-Vit C-Mn (GLUCOSAMINE CHONDR 1500 COMPLX) CAPS Take 1 capsule by mouth daily.    . Multiple Vitamin (MULTIVITAMIN) capsule Take 1 capsule by mouth daily.      . potassium chloride (KLOR-CON) 20 MEQ packet Take 20 mEq by mouth daily.      . predniSONE (DELTASONE) 1 MG tablet Take as directed for dose  adjustment 30 tablet 0  . Probiotic Product (ALIGN PO) Take 1 tablet by mouth daily.      . sertraline (ZOLOFT) 100 MG tablet Take 150 mg by mouth daily.      Marland Kitchen. spironolactone (ALDACTONE) 25 MG tablet Take 25 mg by mouth daily.      . valsartan (DIOVAN) 160 MG tablet Take 320 mg by mouth daily.     . verapamil (CALAN) 80 MG tablet Take 1 tablet (80 mg total) by mouth 3 (three) times daily. 90 tablet 3   No current facility-administered medications on file prior to visit.

## 2016-02-27 NOTE — Telephone Encounter (Signed)
Rx sent to pharmacy. Patient notified. Nothing further needed.  Medication Detail    Disp Refills Start End   verapamil (CALAN) 80 MG tablet 270 tablet 3 02/27/2016    Sig - Route: Take 1 tablet (80 mg total) by mouth 3 (three) times daily. - Oral   E-Prescribing Status: Receipt confirmed by pharmacy (02/27/2016 12:33 PM EDT)

## 2016-02-27 NOTE — Telephone Encounter (Signed)
Ok to refill her Verapamil- we give it for sinus disease

## 2016-03-02 ENCOUNTER — Other Ambulatory Visit: Payer: Self-pay | Admitting: Internal Medicine

## 2016-03-19 ENCOUNTER — Ambulatory Visit: Payer: 59 | Admitting: Internal Medicine

## 2016-05-15 ENCOUNTER — Other Ambulatory Visit: Payer: Self-pay | Admitting: Internal Medicine

## 2016-07-30 ENCOUNTER — Other Ambulatory Visit: Payer: Self-pay

## 2016-07-30 MED ORDER — PREDNISONE 10 MG PO TABS: ORAL_TABLET | ORAL | 3 refills | Status: DC

## 2016-07-30 MED ORDER — PREDNISONE 1 MG PO TABS: ORAL_TABLET | ORAL | 3 refills | Status: DC

## 2016-07-30 NOTE — Telephone Encounter (Signed)
Refill request from pharmacy for pred 10mg  and pred 1mg . Ok's by CY and refills sent in for 1 year.

## 2016-08-12 ENCOUNTER — Ambulatory Visit (INDEPENDENT_AMBULATORY_CARE_PROVIDER_SITE_OTHER): Payer: 59 | Admitting: Internal Medicine

## 2016-08-12 VITALS — BP 118/80 | HR 99 | Ht 72.0 in

## 2016-08-12 DIAGNOSIS — J441 Chronic obstructive pulmonary disease with (acute) exacerbation: Secondary | ICD-10-CM

## 2016-08-12 DIAGNOSIS — J328 Other chronic sinusitis: Secondary | ICD-10-CM

## 2016-08-12 DIAGNOSIS — J4540 Moderate persistent asthma, uncomplicated: Secondary | ICD-10-CM

## 2016-08-12 LAB — NITRIC OXIDE: Other: 26

## 2016-08-12 MED ORDER — PREDNISONE 10 MG PO TABS
ORAL_TABLET | ORAL | 3 refills | Status: DC
Start: 2016-08-12 — End: 2017-07-29

## 2016-08-12 MED ORDER — ALBUTEROL SULFATE HFA 108 (90 BASE) MCG/ACT IN AERS: INHALATION_SPRAY | RESPIRATORY_TRACT | 3 refills | Status: DC

## 2016-08-12 MED ORDER — FLUTICASONE FUROATE-VILANTEROL 200-25 MCG/INH IN AEPB: 1.0000 | INHALATION_SPRAY | Freq: Every day | RESPIRATORY_TRACT | 3 refills | Status: DC

## 2016-08-12 MED ORDER — PREDNISONE 1 MG PO TABS: ORAL_TABLET | ORAL | 3 refills | Status: DC

## 2016-08-12 MED ORDER — FLUTICASONE FUROATE-VILANTEROL 200-25 MCG/INH IN AEPB: 1.0000 | INHALATION_SPRAY | Freq: Every day | RESPIRATORY_TRACT | 0 refills | Status: DC

## 2016-08-12 NOTE — Patient Instructions (Addendum)
Order- FENO      Dx asthma moderate persistent  Order- office spirometry   Sample  Breo 200 Ellipta maintenance inhaler     Inhale 1 puff, once daily, then rinse mouth   Try this instead of Dulera for comparison. Call for script if you like Breo.  Refill scripts sent for prednisone  Please call as needed

## 2016-08-12 NOTE — Progress Notes (Signed)
Patient ID: Kelli PomfretKaren G Fleming, female    DOB: 05/09/1956, 61 y.o.   MRN: [id number]  HPI  female never smoker with atypical chronic rhinosinusitis/polyps/surgery, asthmatic bronchitis NPSG 10/11/12- AHI 0/ hr, mild snoring. Difficulty initiating sleep.  We had given her a trial of verapamil 80 mg 3 times a day based on a recent report that this drug can block an inflammatory protein and chronic nasal inflammation of this type. She continued prednisone maintenance. Sinus surgery- Dr Annalee GentaShoemaker/ ENT  FENO 08/12/16- 26  Insignificant Office Spirometry 08/12/16-mild restriction of exhaled volume. FVC 3.21/73%, FEV1 2.43/72%, ratio 0.76, FEF 25-75% 2.02/ 70% ---------------------------------------------------------------------------------------- 11/16/2015-61 year old female never smoker with atypical chronic rhinosinusitis/polyps/surgery, asthmatic bronchitis We had given her a trial of verapamil 80 mg 3 times a day based on a recent report that this drug can block an inflammatory protein and chronic nasal inflammation of this type. She continued prednisone maintenance. FOLLOWS FOR: Pt states she is doing well overall with her sinus issues. We had given trial verapamil based on a report that it reduced "substance P"-an inflammatory mediator in the nasal mucosa-resulting in marked reduction of chronic rhinosinusitis in the study group She has tolerated verapamil very well and noted a dramatic reduction in sinus inflammation. She has slowly tapered off prednisone, down to 5 mg daily, by 1 mg per week. She has not needed to use her inhalers in a month, reflecting improvement in lower respiratory tract as well. She is very pleased.  08/12/2016-61 year old female never smoker with atypical chronic rhinosinusitis/polyps/surgery, asthmatic bronchitis 6 month follow up. Has been having issues with Dulera, not working as well. Now using verapamil just 1 time daily to avoid blood pressure problems. Definitely feels it  still helps her chronic rhinitis. She adjusts prednisone by one or 2 mg a day as needed but can't get lower than about 13 mg daily maintenance. Has only had 1 simple cold this winter with no progression into severe bronchitis as she did in the past. Doesn't feel [facility] working as well as it used to. Noting more wheeze and need for rescue inhaler. She remembers we had changed from Advair maybe because of insurance.   FENO 08/12/16- 26  Insignificant Office Spirometry 08/12/16-mild restriction of exhaled volume. FVC 3.21/73%, FEV1 2.43/72%, ratio 0.76, FEF 25-75% 2.02/ 70%     she has gained some weight in the last few years. CXR 07/27/2014 had shown mild hyperinflation, not a restrictive process.  Review of Systems-See HPI Constitutional:   No-   weight loss, night sweats, fevers, chills, fatigue, lassitude. HEENT:   No-  headaches, difficulty swallowing, tooth/dental problems, sore throat,       No-  sneezing, itching, ear ache, no-nasal congestion, post nasal drip,  CV:  No-   chest pain, orthopnea, PND, swelling in lower extremities, anasarca, dizziness, palpitations Resp: no- shortness of breath with exertion or at rest.              productive cough,  No non-productive cough,  No- coughing up of blood.              No-   change in color of mucus.  + wheezing.   Skin: No-   rash or lesions. GI:  No-   heartburn, indigestion, abdominal pain, nausea, vomiting,  GU: . MS:  No-   joint pain or swelling.  . Neuro-     nothing unusual Psych:  No- change in mood or affect. No depression or anxiety.  No memory loss.  Objective:   Physical Exam  General- Alert, Oriented, Affect-appropriate- always very pleasant, Distress- none acute; + overweight Skin- + steroid-type ecchymoses on forearms Lymphadenopathy- none Head- atraumatic            Eyes- Gross vision intact, PERRLA, conjunctivae clear secretions.             Ears- Hearing, canals-normal            Nose- Clear anteriorly with polyps  visible -nonocclusive. , no-Septal dev, mucus, erosion, perforation             Throat- Mallampati II , mucosa- red , drainage- none, tonsils- atrophic Neck- flexible , trachea midline, no stridor , thyroid nl, carotid no bruit Chest - symmetrical excursion , unlabored           Heart/CV- RRR , no murmur , no gallop  , no rub, nl s1 s2                           - JVD- none , edema- none, stasis changes- none, varices- none           Lung- unlabored, clear , dullness-none, rub- none, cough-none, wheeze-none           Chest wall-  Abd-  Br/ Gen/ Rectal- Not done, not indicated Extrem- cyanosis- none, clubbing, none, atrophy- none, strength- nl, + brace on right ankle after fracture Neuro- grossly intact to observation

## 2016-08-13 ENCOUNTER — Encounter: Payer: Self-pay | Admitting: Internal Medicine

## 2016-08-13 NOTE — Assessment & Plan Note (Addendum)
Not sure why she is recognizing more wheezing and chest tightness unless it is a seasonal issue. No obvious recent significant infection. FENO is just above the upper limits of normal and doesn't suggest a significant allergic asthma process although she is on maintenance prednisone.  [facility] [place], or possibly AirDuo.Then consider a LABA/ LAMA.

## 2016-08-13 NOTE — Assessment & Plan Note (Addendum)
She continues steroid dependent with impression that verapamil still helps although she reduced dosage to avoid low blood pressure. She understands concerns of maintenance prednisone and has had bone density check. She is doing much better than she was in the first couple of years of this problem.

## 2016-08-14 ENCOUNTER — Telehealth: Payer: Self-pay | Admitting: Internal Medicine

## 2016-08-14 MED ORDER — FLUTICASONE FUROATE-VILANTEROL 200-25 MCG/INH IN AEPB: 1.0000 | INHALATION_SPRAY | Freq: Every day | RESPIRATORY_TRACT | 3 refills | Status: DC

## 2016-08-14 NOTE — Telephone Encounter (Signed)
Spoke with pt. She is needing a prescription for Breo sent to [facility]. This has been sent in. Nothing further was needed.

## 2016-08-26 ENCOUNTER — Telehealth: Payer: Self-pay | Admitting: Internal Medicine

## 2016-08-26 NOTE — Telephone Encounter (Signed)
There are no generics. She needs to check with her insurance formulary to see which product in that class is preferred by her insurance.    These are Kelli Fleming, Breo, Symbicort, Advair, AirDuo

## 2016-08-26 NOTE — Telephone Encounter (Signed)
Spoke with pt and made her aware of CY recc. Pt will contact her insurance and then follow up with us when she cans. Nothing further is needed at this time.

## 2016-08-26 NOTE — Telephone Encounter (Signed)
CY  Please Advise-  Pt states she will not be able to afford the $900 copay for her Breo. She is needing to be placed on a another inhaler that offers a generic version. She wanted to mention again that the [facility] did not work well for her.

## 2017-01-03 DIAGNOSIS — M2011 Hallux valgus (acquired), right foot: Secondary | ICD-10-CM | POA: Diagnosis not present

## 2017-01-03 DIAGNOSIS — M79672 Pain in left foot: Secondary | ICD-10-CM | POA: Diagnosis not present

## 2017-01-03 DIAGNOSIS — M79671 Pain in right foot: Secondary | ICD-10-CM | POA: Diagnosis not present

## 2017-01-10 DIAGNOSIS — Z0000 Encounter for general adult medical examination without abnormal findings: Secondary | ICD-10-CM | POA: Diagnosis not present

## 2017-01-10 DIAGNOSIS — I10 Essential (primary) hypertension: Secondary | ICD-10-CM | POA: Diagnosis not present

## 2017-01-10 DIAGNOSIS — E784 Other hyperlipidemia: Secondary | ICD-10-CM | POA: Diagnosis not present

## 2017-01-15 DIAGNOSIS — K429 Umbilical hernia without obstruction or gangrene: Secondary | ICD-10-CM | POA: Diagnosis not present

## 2017-01-15 DIAGNOSIS — Z0000 Encounter for general adult medical examination without abnormal findings: Secondary | ICD-10-CM | POA: Diagnosis not present

## 2017-01-15 DIAGNOSIS — M2550 Pain in unspecified joint: Secondary | ICD-10-CM | POA: Diagnosis not present

## 2017-01-15 DIAGNOSIS — Z1389 Encounter for screening for other disorder: Secondary | ICD-10-CM | POA: Diagnosis not present

## 2017-02-03 DIAGNOSIS — M79672 Pain in left foot: Secondary | ICD-10-CM | POA: Diagnosis not present

## 2017-02-03 DIAGNOSIS — M21611 Bunion of right foot: Secondary | ICD-10-CM | POA: Diagnosis not present

## 2017-02-03 DIAGNOSIS — M79671 Pain in right foot: Secondary | ICD-10-CM | POA: Diagnosis not present

## 2017-02-10 ENCOUNTER — Encounter: Payer: Self-pay | Admitting: Internal Medicine

## 2017-02-10 ENCOUNTER — Ambulatory Visit (INDEPENDENT_AMBULATORY_CARE_PROVIDER_SITE_OTHER): Payer: 59 | Admitting: Internal Medicine

## 2017-02-10 DIAGNOSIS — J4540 Moderate persistent asthma, uncomplicated: Secondary | ICD-10-CM | POA: Diagnosis not present

## 2017-02-10 DIAGNOSIS — J328 Other chronic sinusitis: Secondary | ICD-10-CM | POA: Diagnosis not present

## 2017-02-10 NOTE — Patient Instructions (Signed)
When possible we want to try reducing prednisone to alternate 10 mg with 15 mg every other day. Our goal is to reduce long-term steroid exposure if possible. Otherwise he can continue present meds.  Please call if we can help.

## 2017-02-10 NOTE — Assessment & Plan Note (Signed)
With combination of maintenance prednisone and verapamil she has continued to do very symptoms. We discussed trying again to reduce prednisone to alternating 10 with 15 mg daily.

## 2017-02-10 NOTE — Progress Notes (Signed)
Patient ID: Kelli PomfretKaren G Fleming, female    DOB: 09/06/1955, 61 y.o.   MRN: [id number]  HPI  female never smoker with atypical chronic rhinosinusitis/polyps/surgery, asthmatic bronchitis NPSG 10/11/12- AHI 0/ hr, mild snoring. Difficulty initiating sleep.  We had given her a trial of verapamil 80 mg 3 times a day based on a recent report that this drug can block an inflammatory protein and chronic nasal inflammation of this type. She continued prednisone maintenance. Sinus surgery- Dr Annalee GentaShoemaker/ ENT  FENO 08/12/16- 26  Insignificant Office Spirometry 08/12/16-mild restriction of exhaled volume. FVC 3.21/73%, FEV1 2.43/72%, ratio 0.76, FEF 25-75% 2.02/ 70% ---------------------------------------------------------------------------------------  08/12/2016-61 year old female never smoker with atypical chronic rhinosinusitis/polyps/surgery, asthmatic bronchitis 6 month follow up. Has been having issues with Dulera, not working as well. Now using verapamil just 1 time daily to avoid blood pressure problems. Definitely feels it still helps her chronic rhinitis. She adjusts prednisone by one or 2 mg a day as needed but can't get lower than about 13 mg daily maintenance. Has only had 1 simple cold this winter with no progression into severe bronchitis as she did in the past. [facility]'t [facility] as well as it used to. Noting more wheeze and need for rescue inhaler. She remembers we had changed from Advair maybe because of insurance.   FENO 08/12/16- 26  Insignificant Office Spirometry 08/12/16-mild restriction of exhaled volume. FVC 3.21/73%, FEV1 2.43/72%, ratio 0.76, FEF 25-75% 2.02/ 70%     she has gained some weight in the last few years. CXR 07/27/2014 had shown mild hyperinflation, not a restrictive process.  02/10/17- 61 year old female never smoker with atypical chronic rhinosinusitis/polyps/surgery, asthmatic bronchitis Feels great with continued verapamil 80 mg 3 times daily and continues Breo 200 with  rare use of rescue inhaler. This has been associated with dramatic improvement in sinus inflammation and reactive lower airways disease as discussed above. Her blood pressure control is also quite good. She does still depend on prednisone 15-20 mg daily. Bone density recheck scheduled this week with Dr. Jacky KindleAronson.  Review of Systems-See HPI Constitutional:   No-   weight loss, night sweats, fevers, chills, fatigue, lassitude. HEENT:   No-  headaches, difficulty swallowing, tooth/dental problems, sore throat,       No-  sneezing, itching, ear ache, no-nasal congestion, post nasal drip,  CV:  No-   chest pain, orthopnea, PND, swelling in lower extremities, anasarca, dizziness, palpitations Resp: no- shortness of breath with exertion or at rest.              productive cough,  No non-productive cough,  No- coughing up of blood.              No-   change in color of mucus.  + wheezing.   Skin: No-   rash or lesions. GI:  No-   heartburn, indigestion, abdominal pain, nausea, vomiting,  GU: . MS:  No-   joint pain or swelling.  . Neuro-     nothing unusual Psych:  No- change in mood or affect. No depression or anxiety.  No memory loss.  Objective:   Physical Exam  General- Alert, Oriented, Affect-appropriate- always very pleasant, Distress- none acute; + overweight Skin- + steroid-type ecchymoses on forearms Lymphadenopathy- none Head- atraumatic            Eyes- Gross vision intact, PERRLA, conjunctivae clear secretions.             Ears- Hearing, canals-normal  Nose- Clear anteriorly with polyps visible -nonocclusive. , no-Septal dev, mucus, erosion, perforation             Throat- Mallampati II , mucosa- red , drainage- none, tonsils- atrophic Neck- flexible , trachea midline, no stridor , thyroid nl, carotid no bruit Chest - symmetrical excursion , unlabored           Heart/CV- RRR , no murmur , no gallop  , no rub, nl s1 s2                           - JVD- none , edema- none,  stasis changes- none, varices- none           Lung- unlabored, clear , dullness-none, rub- none, cough-none, wheeze-none           Chest wall-  Abd-  Br/ Gen/ Rectal- Not done, not indicated Extrem- cyanosis- none, clubbing, none, atrophy- none, strength- nl,  Neuro- grossly intact to observation

## 2017-02-10 NOTE — Assessment & Plan Note (Signed)
Currently well controlled. Improved control of upper airway inflammation significantly contributed. She admits that retiring from work may have also helped. We discussed goal of reducing systemic prednisone.

## 2017-02-12 DIAGNOSIS — Z13820 Encounter for screening for osteoporosis: Secondary | ICD-10-CM | POA: Diagnosis not present

## 2017-04-16 DIAGNOSIS — M79604 Pain in right leg: Secondary | ICD-10-CM | POA: Diagnosis not present

## 2017-04-23 ENCOUNTER — Encounter: Payer: PRIVATE HEALTH INSURANCE | Admitting: Obstetrics & Gynecology

## 2017-05-03 DIAGNOSIS — Z23 Encounter for immunization: Secondary | ICD-10-CM | POA: Diagnosis not present

## 2017-07-14 ENCOUNTER — Other Ambulatory Visit: Payer: Self-pay | Admitting: Internal Medicine

## 2017-07-21 DIAGNOSIS — E7849 Other hyperlipidemia: Secondary | ICD-10-CM | POA: Diagnosis not present

## 2017-07-21 DIAGNOSIS — M2550 Pain in unspecified joint: Secondary | ICD-10-CM | POA: Diagnosis not present

## 2017-07-21 DIAGNOSIS — I10 Essential (primary) hypertension: Secondary | ICD-10-CM | POA: Diagnosis not present

## 2017-07-29 ENCOUNTER — Other Ambulatory Visit: Payer: Self-pay | Admitting: Internal Medicine

## 2017-07-29 DIAGNOSIS — J328 Other chronic sinusitis: Secondary | ICD-10-CM

## 2017-07-31 DIAGNOSIS — Z1231 Encounter for screening mammogram for malignant neoplasm of breast: Secondary | ICD-10-CM | POA: Diagnosis not present

## 2017-09-15 ENCOUNTER — Telehealth: Payer: Self-pay | Admitting: Internal Medicine

## 2017-09-15 NOTE — Telephone Encounter (Signed)
Prednisone 1 mg, # 100   Up to 4 tabs daily for dose reduction when able,    Ref x 3

## 2017-09-15 NOTE — Telephone Encounter (Signed)
Called pharmacist back in regards to message, they are needing the clarification on prednisone. Patient takes the 10mg  prednisone BID and they are wanting the clarification on the 1mg  tablet. CY please advise on this, thanks.

## 2017-09-16 NOTE — Telephone Encounter (Signed)
Called [facility] and spoke with Diane. This matter had already been handled. Nothing further was needed.

## 2017-09-23 ENCOUNTER — Encounter: Payer: PRIVATE HEALTH INSURANCE | Admitting: Obstetrics & Gynecology

## 2017-10-03 ENCOUNTER — Encounter: Payer: PRIVATE HEALTH INSURANCE | Admitting: Obstetrics & Gynecology

## 2017-11-11 ENCOUNTER — Other Ambulatory Visit: Payer: Self-pay | Admitting: Internal Medicine

## 2017-11-13 ENCOUNTER — Other Ambulatory Visit: Payer: Self-pay | Admitting: Internal Medicine

## 2017-11-13 MED ORDER — PREDNISONE 10 MG PO TABS: ORAL_TABLET | ORAL | 3 refills | Status: AC

## 2017-11-13 MED ORDER — PREDNISONE 1 MG PO TABS: ORAL_TABLET | ORAL | 3 refills | Status: AC

## 2017-11-13 NOTE — Telephone Encounter (Signed)
Per CY okay to refill as requested with additional refills.

## 2018-03-16 DIAGNOSIS — Z0000 Encounter for general adult medical examination without abnormal findings: Secondary | ICD-10-CM | POA: Diagnosis not present

## 2018-03-16 DIAGNOSIS — R82998 Other abnormal findings in urine: Secondary | ICD-10-CM | POA: Diagnosis not present

## 2018-03-17 DIAGNOSIS — Z0100 Encounter for examination of eyes and vision without abnormal findings: Secondary | ICD-10-CM | POA: Diagnosis not present

## 2018-03-18 DIAGNOSIS — Z23 Encounter for immunization: Secondary | ICD-10-CM | POA: Diagnosis not present

## 2018-03-18 DIAGNOSIS — M2550 Pain in unspecified joint: Secondary | ICD-10-CM | POA: Diagnosis not present

## 2018-03-18 DIAGNOSIS — Z1389 Encounter for screening for other disorder: Secondary | ICD-10-CM | POA: Diagnosis not present

## 2018-03-18 DIAGNOSIS — Z0000 Encounter for general adult medical examination without abnormal findings: Secondary | ICD-10-CM | POA: Diagnosis not present

## 2018-03-18 DIAGNOSIS — E7849 Other hyperlipidemia: Secondary | ICD-10-CM | POA: Diagnosis not present

## 2018-03-18 DIAGNOSIS — I10 Essential (primary) hypertension: Secondary | ICD-10-CM | POA: Diagnosis not present

## 2018-03-20 DIAGNOSIS — Z1212 Encounter for screening for malignant neoplasm of rectum: Secondary | ICD-10-CM | POA: Diagnosis not present

## 2018-04-23 ENCOUNTER — Ambulatory Visit: Payer: 59 | Admitting: Internal Medicine

## 2018-04-29 DIAGNOSIS — G4733 Obstructive sleep apnea (adult) (pediatric): Secondary | ICD-10-CM | POA: Diagnosis not present

## 2018-07-09 ENCOUNTER — Ambulatory Visit: Payer: 59 | Admitting: Internal Medicine

## 2018-07-13 ENCOUNTER — Other Ambulatory Visit: Payer: Self-pay | Admitting: Internal Medicine

## 2018-07-29 ENCOUNTER — Other Ambulatory Visit: Payer: Self-pay | Admitting: Internal Medicine

## 2018-09-13 ENCOUNTER — Other Ambulatory Visit: Payer: Self-pay | Admitting: Internal Medicine
